# Patient Record
Sex: Female | Born: 1970 | Race: Black or African American | Hispanic: No | Marital: Single | Smoking: Never smoker
Health system: Southern US, Community
[De-identification: ages and names within clinical notes are randomized; demographics above are authoritative.]

## PROBLEM LIST (undated history)

## (undated) DIAGNOSIS — F419 Anxiety disorder, unspecified: Secondary | ICD-10-CM

## (undated) DIAGNOSIS — Z789 Other specified health status: Secondary | ICD-10-CM

## (undated) DIAGNOSIS — F32A Depression, unspecified: Secondary | ICD-10-CM

## (undated) DIAGNOSIS — N809 Endometriosis, unspecified: Secondary | ICD-10-CM

## (undated) DIAGNOSIS — F329 Major depressive disorder, single episode, unspecified: Secondary | ICD-10-CM

## (undated) DIAGNOSIS — R51 Headache: Secondary | ICD-10-CM

## (undated) HISTORY — PX: PELVIC LAPAROSCOPY: SHX162

## (undated) HISTORY — DX: Endometriosis, unspecified: N80.9

---

## 2003-03-13 ENCOUNTER — Encounter: Payer: Self-pay | Admitting: Emergency Medicine

## 2003-03-13 ENCOUNTER — Emergency Department (HOSPITAL_COMMUNITY): Admission: EM | Admit: 2003-03-13 | Discharge: 2003-03-13 | Payer: Self-pay | Admitting: Emergency Medicine

## 2005-04-20 HISTORY — PX: AUGMENTATION MAMMAPLASTY: SUR837

## 2005-04-20 HISTORY — PX: ABDOMINAL SURGERY: SHX537

## 2007-08-29 ENCOUNTER — Inpatient Hospital Stay (HOSPITAL_COMMUNITY): Admission: AD | Admit: 2007-08-29 | Discharge: 2007-09-01 | Payer: Self-pay | Admitting: Obstetrics and Gynecology

## 2007-08-30 ENCOUNTER — Encounter (INDEPENDENT_AMBULATORY_CARE_PROVIDER_SITE_OTHER): Payer: Self-pay | Admitting: Obstetrics and Gynecology

## 2008-09-29 ENCOUNTER — Emergency Department (HOSPITAL_COMMUNITY): Admission: EM | Admit: 2008-09-29 | Discharge: 2008-09-29 | Payer: Self-pay | Admitting: Emergency Medicine

## 2009-08-05 ENCOUNTER — Ambulatory Visit: Payer: Self-pay | Admitting: Gynecology

## 2011-03-01 ENCOUNTER — Emergency Department (HOSPITAL_COMMUNITY)
Admission: EM | Admit: 2011-03-01 | Discharge: 2011-03-01 | Disposition: A | Discharge status: Home / Self Care | Payer: Managed Care, Other (non HMO) | Attending: Emergency Medicine | Admitting: Emergency Medicine

## 2011-03-01 DIAGNOSIS — N938 Other specified abnormal uterine and vaginal bleeding: Secondary | ICD-10-CM | POA: Insufficient documentation

## 2011-03-01 DIAGNOSIS — N949 Unspecified condition associated with female genital organs and menstrual cycle: Secondary | ICD-10-CM | POA: Insufficient documentation

## 2011-03-01 LAB — POCT I-STAT, CHEM 8
BUN: 10 mg/dL (ref 6–23)
Calcium, Ion: 1.06 mmol/L — ABNORMAL LOW (ref 1.12–1.32)
Creatinine, Ser: 0.8 mg/dL (ref 0.4–1.2)
Glucose, Bld: 94 mg/dL (ref 70–99)
Hemoglobin: 13.9 g/dL (ref 12.0–15.0)
TCO2: 28 mmol/L (ref 0–100)

## 2011-03-01 LAB — URINALYSIS, ROUTINE W REFLEX MICROSCOPIC
Glucose, UA: NEGATIVE mg/dL
Protein, ur: NEGATIVE mg/dL
Specific Gravity, Urine: 1.008 (ref 1.005–1.030)
pH: 6.5 (ref 5.0–8.0)

## 2011-03-02 ENCOUNTER — Ambulatory Visit (INDEPENDENT_AMBULATORY_CARE_PROVIDER_SITE_OTHER): Payer: Managed Care, Other (non HMO) | Admitting: Gynecology

## 2011-03-02 DIAGNOSIS — N920 Excessive and frequent menstruation with regular cycle: Secondary | ICD-10-CM

## 2011-03-02 LAB — GC/CHLAMYDIA PROBE AMP, GENITAL
Chlamydia, DNA Probe: NEGATIVE
GC Probe Amp, Genital: NEGATIVE

## 2011-03-14 ENCOUNTER — Ambulatory Visit (INDEPENDENT_AMBULATORY_CARE_PROVIDER_SITE_OTHER): Payer: Managed Care, Other (non HMO) | Admitting: Gynecology

## 2011-03-14 ENCOUNTER — Other Ambulatory Visit: Payer: Self-pay | Admitting: Gynecology

## 2011-03-14 ENCOUNTER — Other Ambulatory Visit: Payer: Managed Care, Other (non HMO)

## 2011-03-14 DIAGNOSIS — N920 Excessive and frequent menstruation with regular cycle: Secondary | ICD-10-CM

## 2011-03-14 DIAGNOSIS — N854 Malposition of uterus: Secondary | ICD-10-CM

## 2011-03-14 DIAGNOSIS — N831 Corpus luteum cyst of ovary, unspecified side: Secondary | ICD-10-CM

## 2011-03-20 ENCOUNTER — Ambulatory Visit (INDEPENDENT_AMBULATORY_CARE_PROVIDER_SITE_OTHER): Payer: Managed Care, Other (non HMO) | Admitting: Gynecology

## 2011-03-20 DIAGNOSIS — Z3049 Encounter for surveillance of other contraceptives: Secondary | ICD-10-CM

## 2011-04-04 NOTE — Op Note (Signed)
NAMECyanna, Neace Mattilynn             ACCOUNT NO.:  [id number]   MEDICAL RECORD NO.:  [id number]          PATIENT TYPE:  INP   LOCATION:  9147                          FACILITY:  [facility]   PHYSICIAN:  Dineen Kid. Rana Snare, M.D.    DATE OF BIRTH:  09-Mar-1971   DATE OF PROCEDURE:  08/30/2007  DATE OF DISCHARGE:                               OPERATIVE REPORT   PREOPERATIVE DIAGNOSES:  Intrauterine pregnancy at 39 weeks, fetal  intolerance to labor.   POSTOPERATIVE DIAGNOSES:  Intrauterine pregnancy at 39 weeks, fetal  intolerance to labor.  Brow presentation.   PROCEDURE:  Primary low segment transverse cesarean section.   SURGEON:  Dineen Kid. Rana Snare, M.D.   ANESTHESIA:  Epidural.   INDICATIONS:  Ms. Ponzo is a 40 year old G5, P2, A2, who presented at  38 weeks with ruptured membranes, clear fluid at 6 a.m.  Pregnancy was  otherwise uncomplicated, except for advanced maternal age, where she  declined amniocentesis.  She progressed to 7 cm, had a protracted labor.  Due to fetal intolerance to labor with variable decelerations,  amnioinfusion was performed.  Unable to advance.  Pitocin augmentation  due to fetal intolerance to labor.  After multiple attempts with  position change, IV fluids, oxygen, amnioinfusion, unable to progress  beyond 7 cm, due to fetal intolerance of labor, we proceeded with  primary low segment transverse cesarean section.  Risks and benefits  were discussed and informed consent was obtained.   FINDINGS AT TIME OF SURGERY:  Viable female infant, Apgars of 6 and 9, pH  arterial 7.18, and brow presentation.   DESCRIPTION OF PROCEDURE:  After adequate analgesia, the patient was  placed in the supine position, left lateral tilt.  She was sterilely  prepped and draped.  The bladder was sterilely drained.  A Pfannenstiel  skin incision was made two fingerbreadths above the pubic symphysis,  taken down sharply to the fascia, which was incised transversely,  extended superiorly  and inferiorly.  The bellies of the rectus muscle  were separated sharply in the midline.  The peritoneum was entered  sharply, a bladder flap created and placed behind the bladder blade.  A  low segment myotomy incision was made down to the infant's vertex,  extended laterally with the operator's fingertips.  The infant's vertex  was delivered atraumatically.  The nares and pharynx were suctioned.  The infant was delivered, cord clamped, cut and handed to the  pediatrician for resuscitation.   Cord blood was obtained.  Placenta was extracted manually.  The uterus  was exteriorized, wiped clean with a dry lap.  The myotomy incision was  closed in two layers, the first being a running, locking layer, the  second being an imbricating layer, with 0 Monocryl suture.  A 3-4 cm  hematoma was noted in the bladder wall, but it was no longer expanding.  The uterus was placed back into the peritoneal cavity and, after a  copious amount of irrigation, adequate hemostasis was assured.  Re-  examination of the bladder hematoma revealed no further expansion of the  hematoma.  The peritoneum was then closed  with 0 Monocryl suture, rectus  muscle plicated in the midline.  Irrigation was applied and after  adequate hemostasis, the fascia was closed with a #1 Vicryl in running  fashion.  Irrigation was applied.  After adequate hemostasis, the Scarpa  and Camper's fascia were reapproximated with 0 plain suture.  The  skin was then stapled.  Steri-Strips were applied.  The patient  tolerated the procedure well and was stable on transfer to the recovery  room.   Sponge and instrument count was normal times three.  Estimated blood  loss was 800 mL.      Dineen Kid Rana Snare, M.D.  Electronically Signed     DCL/MEDQ  D:  08/30/2007  T:  08/30/2007  Job:  [id number]

## 2011-04-07 NOTE — Discharge Summary (Signed)
NAMEArva, Slaugh Maudean             ACCOUNT NO.:  [id number]   MEDICAL RECORD NO.:  [id number]          PATIENT TYPE:  INP   LOCATION:  9147                          FACILITY:  [facility]   PHYSICIAN:  Michelle L. Grewal, M.D.DATE OF BIRTH:  [birth year]   DATE OF ADMISSION:  08/29/2007  DATE OF DISCHARGE:  09/01/2007                               DISCHARGE SUMMARY   ADMITTING DIAGNOSIS:  1. Intrauterine pregnancy at 2 weeks estimated gestational age.  2. Spontaneous rupture of membranes.   DISCHARGE DIAGNOSIS:  1. Status post low transverse cesarean section secondary to fetal      intolerance to labor with brow out presentation.  2. Viable female infant.   PROCEDURE:  Primary low transverse cesarean section.   REASON FOR ADMISSION:  Please see written H&P.   HOSPITAL COURSE:  The patient is 40 year old, gravida 5, para 2 that  presented to [facility] at 38 weeks estimated  gestational age with spontaneous rupture of membranes.  Fluid was noted  to be clear on admission, fetal heart tones were reactive. The cervix  was examined and noted to be 3 cm dilated, vertex presentation at a -2  station.  The patient did progress to 7 cm dilated however, the patient  did experience some protracted labor and amnioinfusion was performed.  The Pitocin augmentation was administered to augment her labor however,  the fetus did not tolerate well recurrent variable decelerations.  Pitocin was discontinued and after multiple attempts with position  change and IV fluids and oxygen administration, the patient never  progressed beyond 7 cm in dilatation. A decision was made then to  proceed with a primary low transverse cesarean section.  The patient was  then transferred to the operating room where epidural was dosed to an  adequate surgical level. A low transverse incision was made with the  delivery of viable female infant weighing 7 pounds 6 ounces, Apgar's of 6  at 1 minutes 9 at 5 minutes.   Arterial cord pH was 7.18.  The patient  tolerated the procedure well and was taken to the recovery room in  stable condition.  On postoperative day #1, the patient was without  complaint.  Vital signs were stable.  She was afebrile.  Urine output  was noted to be adequate.  The abdomen was soft, fundus firm and  nontender.  Abdominal dressing was noted to be clean, dry and intact.   LABORATORY FINDINGS:  Hemoglobin of 8.9, platelet count of 233,000, WBC  count of 11.9. On postoperative day #2, the patient was without  complaint, vital signs remained stable, she is afebrile.  Abdomen soft.  Fundus firm and nontender.  Abdominal dressing had been removed  revealing an incision that was clean, dry and intact.  She was  ambulating well. On postoperative day #3, the patient was without  complaint.  Vital signs were stable.  She was afebrile.  Fundus firm and  nontender.  Incision was clean, dry and intact.  Discharge instructions  were reviewed and the patient was later discharged home.   CONDITION ON DISCHARGE:  Good.   DIET:  Regular as tolerated.   ACTIVITY:  No heavy lifting, no driving x2 weeks, no vaginal entry.   FOLLOW UP:  Patient to follow up in the office in 1-2 weeks for an  incision check.  She is to call for a temperature greater than 100  degrees, persistent nausea, vomiting, heavy vaginal bleeding and/or  redness or drainage to the incisional site.   DISCHARGE MEDICATION:  1. Tylox x30, 1 p.o. every 4-6 hours.  2. Motrin 600 mg every 6 hours.  3. Prenatal vitamins one p.o. daily.  4. Colace 1 p.o. daily p.r.n.      Julio Sicks, N.P.      Stann Mainland. Vincente Poli, M.D.  Electronically Signed    CC/MEDQ  D:  09/25/2007  T:  09/25/2007  Job:  [id number]

## 2011-04-19 ENCOUNTER — Ambulatory Visit (INDEPENDENT_AMBULATORY_CARE_PROVIDER_SITE_OTHER): Payer: Managed Care, Other (non HMO) | Admitting: Gynecology

## 2011-04-19 DIAGNOSIS — Z30431 Encounter for routine checking of intrauterine contraceptive device: Secondary | ICD-10-CM

## 2011-04-19 HISTORY — PX: INTRAUTERINE DEVICE INSERTION: SHX323

## 2011-05-17 ENCOUNTER — Ambulatory Visit (INDEPENDENT_AMBULATORY_CARE_PROVIDER_SITE_OTHER): Payer: Managed Care, Other (non HMO) | Admitting: Gynecology

## 2011-05-17 DIAGNOSIS — N920 Excessive and frequent menstruation with regular cycle: Secondary | ICD-10-CM

## 2011-05-17 DIAGNOSIS — Z30431 Encounter for routine checking of intrauterine contraceptive device: Secondary | ICD-10-CM

## 2011-05-23 ENCOUNTER — Other Ambulatory Visit (HOSPITAL_COMMUNITY)
Admission: RE | Admit: 2011-05-23 | Discharge: 2011-05-23 | Disposition: A | Discharge status: Home / Self Care | Payer: Managed Care, Other (non HMO) | Source: Ambulatory Visit | Attending: Gynecology | Admitting: Gynecology

## 2011-05-23 ENCOUNTER — Other Ambulatory Visit: Payer: Self-pay | Admitting: Gynecology

## 2011-05-23 ENCOUNTER — Encounter (INDEPENDENT_AMBULATORY_CARE_PROVIDER_SITE_OTHER): Payer: Managed Care, Other (non HMO) | Admitting: Gynecology

## 2011-05-23 DIAGNOSIS — Z833 Family history of diabetes mellitus: Secondary | ICD-10-CM

## 2011-05-23 DIAGNOSIS — Z01419 Encounter for gynecological examination (general) (routine) without abnormal findings: Secondary | ICD-10-CM

## 2011-05-23 DIAGNOSIS — Z124 Encounter for screening for malignant neoplasm of cervix: Secondary | ICD-10-CM | POA: Insufficient documentation

## 2011-05-23 DIAGNOSIS — Z13220 Encounter for screening for lipoid disorders: Secondary | ICD-10-CM

## 2011-08-31 LAB — CBC
HCT: 39
Hemoglobin: 13.3
Hemoglobin: 8.9 — ABNORMAL LOW
MCHC: 34.3
MCV: 83.4
RBC: 3.11 — ABNORMAL LOW
RDW: 13.6
WBC: 9.3

## 2011-08-31 LAB — CCBB MATERNAL DONOR DRAW

## 2011-09-28 ENCOUNTER — Ambulatory Visit
Admission: RE | Admit: 2011-09-28 | Discharge: 2011-09-28 | Disposition: A | Discharge status: Home / Self Care | Payer: Managed Care, Other (non HMO) | Source: Ambulatory Visit | Attending: Family Medicine | Admitting: Family Medicine

## 2011-09-28 ENCOUNTER — Other Ambulatory Visit (INDEPENDENT_AMBULATORY_CARE_PROVIDER_SITE_OTHER): Payer: Self-pay | Admitting: General Surgery

## 2011-09-28 ENCOUNTER — Encounter (HOSPITAL_COMMUNITY): Payer: Self-pay | Admitting: Emergency Medicine

## 2011-09-28 ENCOUNTER — Emergency Department (HOSPITAL_COMMUNITY)
Admission: EM | Admit: 2011-09-28 | Discharge: 2011-09-28 | Disposition: A | Discharge status: Home / Self Care | Payer: Managed Care, Other (non HMO) | Attending: Emergency Medicine | Admitting: Emergency Medicine

## 2011-09-28 ENCOUNTER — Other Ambulatory Visit: Payer: Self-pay | Admitting: Family Medicine

## 2011-09-28 DIAGNOSIS — K8020 Calculus of gallbladder without cholecystitis without obstruction: Secondary | ICD-10-CM

## 2011-09-28 DIAGNOSIS — R112 Nausea with vomiting, unspecified: Secondary | ICD-10-CM | POA: Insufficient documentation

## 2011-09-28 DIAGNOSIS — R1011 Right upper quadrant pain: Secondary | ICD-10-CM | POA: Insufficient documentation

## 2011-09-28 DIAGNOSIS — R109 Unspecified abdominal pain: Secondary | ICD-10-CM

## 2011-09-28 LAB — LIPASE, BLOOD: Lipase: 22 U/L (ref 11–59)

## 2011-09-28 LAB — COMPREHENSIVE METABOLIC PANEL
Albumin: 4 g/dL (ref 3.5–5.2)
Alkaline Phosphatase: 80 U/L (ref 39–117)
BUN: 8 mg/dL (ref 6–23)
CO2: 30 mEq/L (ref 19–32)
Chloride: 99 mEq/L (ref 96–112)
GFR calc non Af Amer: 88 mL/min — ABNORMAL LOW (ref >90)
Potassium: 3.9 mEq/L (ref 3.5–5.1)
Total Bilirubin: 0.4 mg/dL (ref 0.3–1.2)

## 2011-09-28 LAB — CBC
HCT: 42.2 % (ref 36.0–46.0)
Hemoglobin: 14.4 g/dL (ref 12.0–15.0)
MCV: 84.7 fL (ref 78.0–100.0)
RBC: 4.98 MIL/uL (ref 3.87–5.11)
RDW: 12.8 % (ref 11.5–15.5)
WBC: 7.4 10*3/uL (ref 4.0–10.5)

## 2011-09-28 NOTE — ED Notes (Signed)
Pt states she was sent to ed by her pcp had ultrasound today and was told to present to ed for r/o gallstones. Pt denies pain at this time pt states positive nausea and vomiting

## 2011-09-28 NOTE — ED Notes (Signed)
Patient is resting comfortably. 

## 2011-09-28 NOTE — ED Notes (Signed)
Reports nausea and vomitting. Denies pain today. Pain was 10/10 on Monday night. Went to urgent care and been on pain meds since.

## 2011-09-28 NOTE — ED Provider Notes (Signed)
History     CSN: [id number] Arrival date & time: 09/28/2011  4:23 PM   First MD Initiated Contact with Patient 09/28/11 1642      Chief Complaint  Patient presents with  . Abdominal Pain     HPI-General screening exam  History is provided by the patient.  Patient is sent from PCP for further evaluation of upper abdominal pain and rule out gallstone.  Dr. Kae Heller from Gen. surgery was consulted on the patient has been to evaluate patient. Patient has been having pains associated around eating with nausea and vomiting symptoms.  She denies fever or pain at this time.  Patient states testing has been completed and Dr. Kae Heller has developed a plan for outpatient surgery with her.  Labs today do not show any emergent conditions.  The patient states she is ready to return home.   Past Medical History  Diagnosis Date  . No active medical problems     Past Surgical History  Procedure Date  . Cesarean section   . Cosmetic surgery 04/2005    abdominoplasty/breast augmentation  . Augmentation mammaplasty 04/2005  . Abdominal surgery 04/2005    abdominoplasty  . Cesarean section 08/2007    History reviewed. No pertinent family history.  History  Substance Use Topics  . Smoking status: Unknown If Ever Smoked  . Smokeless tobacco: Not on file  . Alcohol Use: No    OB History    Grav Para Term Preterm Abortions TAB SAB Ect Mult Living                  Review of Systems  Constitutional: Negative for fever.  Gastrointestinal: Positive for nausea, vomiting and abdominal pain.    Allergies  Review of patient's allergies indicates no known allergies.  Home Medications   Current Outpatient Rx  Name Route Sig Dispense Refill  . ESOMEPRAZOLE MAGNESIUM 40 MG PO CPDR Oral Take 40 mg by mouth daily before breakfast.      . ONDANSETRON HCL 4 MG PO TABS Oral Take 4 mg by mouth every 6 (six) hours.      Marland Kitchen PHENTERMINE HCL 37.5 MG PO CAPS Oral Take 37.5 mg by mouth every morning.       Marland Kitchen TRAMADOL HCL 50 MG PO TABS Oral Take 50 mg by mouth every 4 (four) hours. Maximum dose= 8 tablets per day     . LEVONORGESTREL 20 MCG/24HR IU IUD Intrauterine 1 each by Intrauterine route once. Inserted 04/19/11       BP 115/78  Pulse 83  Temp(Src) 98.5 F (36.9 C) (Oral)  Resp 20  SpO2 100%  LMP 08/30/2011  Physical Exam  Constitutional: She is oriented to person, place, and time. She appears well-developed and well-nourished.  Cardiovascular: Normal rate and regular rhythm.   Abdominal: There is tenderness in the right upper quadrant and epigastric area.       Tenderness is mild  Neurological: She is alert and oriented to person, place, and time.  Skin: Skin is warm.    ED Course  Procedures (including critical care time)  Labs Reviewed  COMPREHENSIVE METABOLIC PANEL - Abnormal; Notable for the following:    ALT 46 (*)    GFR calc non Af Amer 88 (*)    All other components within normal limits  CBC  LIPASE, BLOOD   Results for orders placed during the hospital encounter of 09/28/11  CBC      Component Value Range   WBC 7.4  4.0 -  10.5 (K/uL)   RBC 4.98  3.87 - 5.11 (MIL/uL)   Hemoglobin 14.4  12.0 - 15.0 (g/dL)   HCT 16.1  09.6 - 04.5 (%)   MCV 84.7  78.0 - 100.0 (fL)   MCH 28.9  26.0 - 34.0 (pg)   MCHC 34.1  30.0 - 36.0 (g/dL)   RDW 40.9  81.1 - 91.4 (%)   Platelets 270  150 - 400 (K/uL)  COMPREHENSIVE METABOLIC PANEL      Component Value Range   Sodium 136  135 - 145 (mEq/L)   Potassium 3.9  3.5 - 5.1 (mEq/L)   Chloride 99  96 - 112 (mEq/L)   CO2 30  19 - 32 (mEq/L)   Glucose, Bld 77  70 - 99 (mg/dL)   BUN 8  6 - 23 (mg/dL)   Creatinine, Ser 7.82  0.50 - 1.10 (mg/dL)   Calcium 9.4  8.4 - 95.6 (mg/dL)   Total Protein 7.1  6.0 - 8.3 (g/dL)   Albumin 4.0  3.5 - 5.2 (g/dL)   AST 19  0 - 37 (U/L)   ALT 46 (*) 0 - 35 (U/L)   Alkaline Phosphatase 80  39 - 117 (U/L)   Total Bilirubin 0.4  0.3 - 1.2 (mg/dL)   GFR calc non Af Amer 88 (*) >90 (mL/min)   GFR  calc Af Amer >90  >90 (mL/min)  LIPASE, BLOOD      Component Value Range   Lipase 22  11 - 59 (U/L)     US Abdomen Complete  09/28/2011  *RADIOLOGY REPORT*  Clinical Data:  Right upper quadrant pain, elevated LFTs  ABDOMINAL ULTRASOUND COMPLETE  Comparison:  None.  Findings:  Gallbladder:  Numerous echogenic shadowing layering gallstones. Despite this, there is no elicited Murphy's sign, wall thickening or pericholecystic fluid.  No signs of cholecystitis.  Wall thickness measures 1.9 mm.  Common Bile Duct:  Within normal limits in caliber.  Liver: Normal echogenicity.  Incidental right posterior hepatic lobe 11 mm cyst beneath the diaphragm identified.  No biliary dilatation.  IVC:  Appears normal.  Pancreas:  No abnormality identified.  Spleen:  Within normal limits in size and echotexture.  Right kidney:  Normal in size and parenchymal echogenicity.  No evidence of mass or hydronephrosis.  Left kidney:  Normal in size and parenchymal echogenicity.  No evidence of mass or hydronephrosis.  Abdominal Aorta:  No aneurysm identified.  IMPRESSION: Cholelithiasis.  No ultrasound evidence of acute cholecystitis.  Incidental 11 mm hepatic cyst.  Original Report Authenticated By: Judie Petit. Ruel Favors, M.D.     1. Cholelithiasis       MDM          Angus Seller, [place] 09/28/11 2052

## 2011-09-28 NOTE — ED Notes (Signed)
Pt here from dr office for gallstones.

## 2011-09-28 NOTE — Progress Notes (Signed)
Reason for Consult:abdominal pain and gallstones Referring Physician: Dr. Enrico Jones  Christina Ramsey is an 40 y.o. female.  HPI:   She had an episode of pressure type mid sternal pain and nausea last month that resolved spontaneously. 3 days ago she had a severe case of epigastric pain with nausea and vomiting. She went to urgent care and was seen by Dr. Enrico Jones.  Lab work was done and demonstrated mild elevation of some of her liver function tests. She underwent ultrasound today demonstrating gallstones but no gallbladder wall thickening or pericholecystic fluid. No dilated common bile duct. There was concern that she might have acute cholecystitis and we were asked to see here. She was directed to the emergency department at [facility] for further evaluation and treatment.  No fever or chills. No diarrhea.  Past Medical History  Diagnosis Date  . No active medical problems     Past Surgical History  Procedure Date  . Cesarean section   . Cosmetic surgery 04/2005    abdominoplasty/breast augmentation  . Augmentation mammaplasty 04/2005  . Abdominal surgery 04/2005    abdominoplasty  . Cesarean section 08/2007    Allergies: No Known Allergies  Medications: I have reviewed the patient's current medications.  No family history on file.  Social History:  has an unknown smoking status. She does not have any smokeless tobacco history on file. She reports that she does not drink alcohol or use illicit drugs.  ROS General:  Negative  Breast:  Negative  Infectious Diseases: Negative  Cardiac  :  Negative  Pulmonary:  Negative  Endocrine:  Negative  Skin:  Negative  Gastrointestinal:  Negative  Genitourinary:  Negative  Neurological:  Negative  Hematologic/Lymphatic:  Negative  HEENT:  Negative  Musculoskeletal:  Negative   Blood pressure 115/78, pulse 83, temperature 98.5 F (36.9 C), temperature source Oral, resp. rate 20, last menstrual period 08/30/2011, SpO2  100.00%.  BP 115/78  Pulse 83  Temp(Src) 98.5 F (36.9 C) (Oral)  Resp 20  SpO2 100%  LMP 08/30/2011  PE        General Appearance:  Alert, cooperative, no distress, appears stated age  Head:  Normocephalic, without obvious abnormality, atraumatic  Eyes:   Conjunctiva/corneas clear, EOM's intact      Nose: Nares normal, no drainage   Mouth: Mucous membranes moist  Neck: Supple, symmetrical, trachea midline, no tenderness/mass/nodules, no JVD  Back:   Symmetric, no curvature, no CVA tenderness  Lungs:   Clear to auscultation bilaterally, respirations unlabored  Chest Wall:  No tenderness or deformity  Heart:  Regular rate and rhythm, S1, S2 normal, no murmur, rub or gallop  Abdomen:   Soft, non-tender, bowel sounds active all four quadrants,  no masses, no organomegaly, lower midline and periumbilical scars.          Extremities: Extremities normal, atraumatic, no cyanosis or edema     Skin: Skin color, texture, turgor normal, no rashes or lesions  Lymph nodes: No enlarged cervical or supraclavicular nodes                                  Neuro:  Alert and oriented, no focal deficits          Results for orders placed during the hospital encounter of 09/28/11 (from the past 48 hour(s))  CBC     Status: Normal   Collection Time   09/28/11  5:30 PM        Component Value Range Comment   WBC 7.4  4.0 - 10.5 (K/uL)    RBC 4.98  3.87 - 5.11 (MIL/uL)    Hemoglobin 14.4  12.0 - 15.0 (g/dL)    HCT 42.2  36.0 - 46.0 (%)    MCV 84.7  78.0 - 100.0 (fL)    MCH 28.9  26.0 - 34.0 (pg)    MCHC 34.1  30.0 - 36.0 (g/dL)    RDW 12.8  11.5 - 15.5 (%)    Platelets 270  150 - 400 (K/uL)   COMPREHENSIVE METABOLIC PANEL     Status: Abnormal   Collection Time   09/28/11  5:30 PM      Component Value Range Comment   Sodium 136  135 - 145 (mEq/L)    Potassium 3.9  3.5 - 5.1 (mEq/L)    Chloride 99  96 - 112 (mEq/L)    CO2 30  19 - 32 (mEq/L)    Glucose, Bld 77  70 - 99 (mg/dL)    BUN 8   6 - 23 (mg/dL)    Creatinine, Ser 0.83  0.50 - 1.10 (mg/dL)    Calcium 9.4  8.4 - 10.5 (mg/dL)    Total Protein 7.1  6.0 - 8.3 (g/dL)    Albumin 4.0  3.5 - 5.2 (g/dL)    AST 19  0 - 37 (U/L)    ALT 46 (*) 0 - 35 (U/L)    Alkaline Phosphatase 80  39 - 117 (U/L)    Total Bilirubin 0.4  0.3 - 1.2 (mg/dL)    GFR calc non Af Amer 88 (*) >90 (mL/min)    GFR calc Af Amer >90  >90 (mL/min)   LIPASE, BLOOD     Status: Normal   Collection Time   09/28/11  5:30 PM      Component Value Range Comment   Lipase 22  11 - 59 (U/L)     Us Abdomen Complete  09/28/2011  *RADIOLOGY REPORT*  Clinical Data:  Right upper quadrant pain, elevated LFTs  ABDOMINAL ULTRASOUND COMPLETE  Comparison:  None.  Findings:  Gallbladder:  Numerous echogenic shadowing layering gallstones. Despite this, there is no elicited Murphy's sign, wall thickening or pericholecystic fluid.  No signs of cholecystitis.  Wall thickness measures 1.9 mm.  Common Bile Duct:  Within normal limits in caliber.  Liver: Normal echogenicity.  Incidental right posterior hepatic lobe 11 mm cyst beneath the diaphragm identified.  No biliary dilatation.  IVC:  Appears normal.  Pancreas:  No abnormality identified.  Spleen:  Within normal limits in size and echotexture.  Right kidney:  Normal in size and parenchymal echogenicity.  No evidence of mass or hydronephrosis.  Left kidney:  Normal in size and parenchymal echogenicity.  No evidence of mass or hydronephrosis.  Abdominal Aorta:  No aneurysm identified.  IMPRESSION: Cholelithiasis.  No ultrasound evidence of acute cholecystitis.  Incidental 11 mm hepatic cyst.  Original Report Authenticated By: M. TREVOR SHICK, M.D.    Assessment/Plan: Acute biliary colic secondary to cholelithiasis that has resolved. No evidence of acute cholecystitis.  Plan: Discharge emergency department. Strict low-fat nonfat diet. Schedule elective outpatient  Laparoscopic cholecystectomy.  I have explained the procedure, risks,  and aftercare of cholecystectomy.  Risks include but are not limited to bleeding, infection, wound problems, anesthesia, diarrhea, bile leak, injury to common bile duct/liver/intestine.  She seems to understand and agrees to proceed.   Demika Langenderfer J 09/28/2011, 7:02 PM      

## 2011-09-28 NOTE — ED Notes (Signed)
Family at bedside. 

## 2011-09-29 ENCOUNTER — Telehealth (INDEPENDENT_AMBULATORY_CARE_PROVIDER_SITE_OTHER): Payer: Self-pay

## 2011-09-29 ENCOUNTER — Other Ambulatory Visit (INDEPENDENT_AMBULATORY_CARE_PROVIDER_SITE_OTHER): Payer: Self-pay | Admitting: General Surgery

## 2011-09-29 NOTE — Telephone Encounter (Signed)
The patient called in to get scheduled for lap chole and had some questions about recovery.  I advised recovery is about 1 to 2 weeks.  She could be out of work up to 2 weeks depending on what type of work she does.  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

## 2011-09-29 NOTE — ED Provider Notes (Signed)
Medical screening examination/treatment/procedure(s) were performed by non-physician practitioner and as supervising physician I was immediately available for consultation/collaboration.   Nelia Shi, MD 09/29/11 [phone]

## 2011-10-04 ENCOUNTER — Telehealth (INDEPENDENT_AMBULATORY_CARE_PROVIDER_SITE_OTHER): Payer: Self-pay

## 2011-10-04 NOTE — Telephone Encounter (Signed)
Spoke with patient to remind her no asa products 7 days prior to her lap cholecystectomy.

## 2011-10-25 ENCOUNTER — Encounter (HOSPITAL_COMMUNITY)
Admission: RE | Admit: 2011-10-25 | Discharge: 2011-10-25 | Disposition: A | Discharge status: Home / Self Care | Payer: Managed Care, Other (non HMO) | Source: Ambulatory Visit | Attending: General Surgery | Admitting: General Surgery

## 2011-10-25 ENCOUNTER — Encounter (HOSPITAL_COMMUNITY): Payer: Self-pay

## 2011-10-25 NOTE — Pre-Procedure Instructions (Signed)
Chest x ray per anesthesia not done due to negative history

## 2011-10-25 NOTE — Pre-Procedure Instructions (Signed)
EKG REPORT 11/12 ON CHART,   LABS- CBC 09/28/11,CMP 09/29/11 IN EPIC

## 2011-10-25 NOTE — Patient Instructions (Signed)
20 Christina Ramsey  10/25/2011   Your procedure is scheduled on:  10/26/11  Surgery  [id number]  Thursday  Report to [facility] at 1115 AM.  Call this number if you have problems the morning of surgery: [phone]   [facility]  PST [id number]   Remember:   Do not eat food:After Midnight. tonight  May have clear liquids:until Midnight . tonight  Clear liquids include soda, tea, black coffee, apple or grape juice, broth.  Take these medicines the morning of surgery with A SIP OF WATER: none              No antiinflammatories  Do not wear jewelry, make-up or nail polish.  Do not wear lotions, powders, or perfumes. You may wear deodorant.  Do not shave 48 hours prior to surgery.  Do not bring valuables to the hospital.  Contacts, dentures or bridgework may not be worn into surgery.  Leave suitcase in the car. After surgery it may be brought to your room.  For patients admitted to the hospital, checkout time is 11:00 AM the day of discharge.   Patients discharged the day of surgery will not be allowed to drive home.  Name and phone number of your driver:friend  Special Instructions: CHG Shower Use Special Wash: 1/2 bottle night before surgery and 1/2 bottle morning of surgery.  Regular soap face and privates, no shaving tonight.       Please read over the following fact sheets that you were given: MRSA Information

## 2011-10-26 ENCOUNTER — Encounter (HOSPITAL_COMMUNITY): Payer: Self-pay | Admitting: *Deleted

## 2011-10-26 ENCOUNTER — Ambulatory Visit (HOSPITAL_COMMUNITY): Payer: Managed Care, Other (non HMO) | Admitting: Anesthesiology

## 2011-10-26 ENCOUNTER — Other Ambulatory Visit (INDEPENDENT_AMBULATORY_CARE_PROVIDER_SITE_OTHER): Payer: Self-pay | Admitting: General Surgery

## 2011-10-26 ENCOUNTER — Ambulatory Visit (HOSPITAL_COMMUNITY)
Admission: RE | Admit: 2011-10-26 | Discharge: 2011-10-26 | Disposition: A | Discharge status: Home / Self Care | Payer: Managed Care, Other (non HMO) | Source: Ambulatory Visit | Attending: General Surgery | Admitting: General Surgery

## 2011-10-26 ENCOUNTER — Encounter (HOSPITAL_COMMUNITY)
Admission: RE | Disposition: A | Discharge status: Home / Self Care | Payer: Self-pay | Source: Ambulatory Visit | Attending: General Surgery

## 2011-10-26 ENCOUNTER — Encounter (HOSPITAL_COMMUNITY): Payer: Self-pay | Admitting: Anesthesiology

## 2011-10-26 ENCOUNTER — Ambulatory Visit (HOSPITAL_COMMUNITY): Payer: Managed Care, Other (non HMO)

## 2011-10-26 DIAGNOSIS — K8010 Calculus of gallbladder with chronic cholecystitis without obstruction: Secondary | ICD-10-CM

## 2011-10-26 DIAGNOSIS — Z01812 Encounter for preprocedural laboratory examination: Secondary | ICD-10-CM | POA: Insufficient documentation

## 2011-10-26 HISTORY — PX: CHOLECYSTECTOMY: SHX55

## 2011-10-26 SURGERY — LAPAROSCOPIC CHOLECYSTECTOMY WITH INTRAOPERATIVE CHOLANGIOGRAM
Anesthesia: General | Site: Abdomen | Wound class: Clean Contaminated

## 2011-10-26 MED ORDER — ROCURONIUM BROMIDE 100 MG/10ML IV SOLN
INTRAVENOUS | Status: DC | PRN
  Administered 2011-10-26: 40 mg via INTRAVENOUS

## 2011-10-26 MED ORDER — BUPIVACAINE HCL 0.5 % IJ SOLN
INTRAMUSCULAR | Status: AC
  Filled 2011-10-26: qty 1

## 2011-10-26 MED ORDER — IOHEXOL 300 MG/ML  SOLN
INTRAMUSCULAR | Status: AC
  Filled 2011-10-26: qty 1

## 2011-10-26 MED ORDER — KETOROLAC TROMETHAMINE 30 MG/ML IJ SOLN
INTRAMUSCULAR | Status: DC | PRN
  Administered 2011-10-26: 30 mg via INTRAVENOUS

## 2011-10-26 MED ORDER — IOHEXOL 300 MG/ML  SOLN
INTRAMUSCULAR | Status: DC | PRN
  Administered 2011-10-26: 2 mL via INTRAVENOUS

## 2011-10-26 MED ORDER — CEFAZOLIN SODIUM 1-5 GM-% IV SOLN
1.0000 g | INTRAVENOUS | Status: AC
  Administered 2011-10-26: 1 g via INTRAVENOUS

## 2011-10-26 MED ORDER — ACETAMINOPHEN 10 MG/ML IV SOLN
INTRAVENOUS | Status: AC
  Filled 2011-10-26: qty 100

## 2011-10-26 MED ORDER — OXYCODONE HCL 5 MG PO TABS
ORAL_TABLET | ORAL | Status: AC
  Administered 2011-10-26: 5 mg via ORAL
  Filled 2011-10-26: qty 1

## 2011-10-26 MED ORDER — FENTANYL CITRATE 0.05 MG/ML IJ SOLN
INTRAMUSCULAR | Status: DC | PRN
  Administered 2011-10-26: 100 ug via INTRAVENOUS
  Administered 2011-10-26: 50 ug via INTRAVENOUS

## 2011-10-26 MED ORDER — LIDOCAINE HCL (CARDIAC) 20 MG/ML IV SOLN
INTRAVENOUS | Status: DC | PRN
  Administered 2011-10-26: 50 mg via INTRAVENOUS

## 2011-10-26 MED ORDER — FENTANYL CITRATE 0.05 MG/ML IJ SOLN
INTRAMUSCULAR | Status: AC
  Filled 2011-10-26: qty 2

## 2011-10-26 MED ORDER — BUPIVACAINE HCL 0.5 % IJ SOLN
INTRAMUSCULAR | Status: DC | PRN
  Administered 2011-10-26: 12 mL

## 2011-10-26 MED ORDER — SODIUM CHLORIDE 0.9 % IR SOLN
Status: DC | PRN
  Administered 2011-10-26: 1000 mL

## 2011-10-26 MED ORDER — MIDAZOLAM HCL 5 MG/5ML IJ SOLN
INTRAMUSCULAR | Status: DC | PRN
  Administered 2011-10-26: 2 mg via INTRAVENOUS

## 2011-10-26 MED ORDER — LACTATED RINGERS IV SOLN
INTRAVENOUS | Status: DC
  Administered 2011-10-26: 1000 mL via INTRAVENOUS

## 2011-10-26 MED ORDER — LACTATED RINGERS IR SOLN
Status: DC | PRN
  Administered 2011-10-26: 1000 mL

## 2011-10-26 MED ORDER — NEOSTIGMINE METHYLSULFATE 1 MG/ML IJ SOLN
INTRAMUSCULAR | Status: DC | PRN
  Administered 2011-10-26: 3 mg via INTRAVENOUS

## 2011-10-26 MED ORDER — PROMETHAZINE HCL 25 MG/ML IJ SOLN
12.5000 mg | Freq: Four times a day (QID) | INTRAMUSCULAR | Status: DC | PRN
  Administered 2011-10-26: 12.5 mg via INTRAVENOUS

## 2011-10-26 MED ORDER — KETOROLAC TROMETHAMINE 60 MG/2ML IM SOLN: INTRAMUSCULAR | Status: DC | PRN

## 2011-10-26 MED ORDER — CEFAZOLIN SODIUM 1-5 GM-% IV SOLN
INTRAVENOUS | Status: AC
  Filled 2011-10-26: qty 50

## 2011-10-26 MED ORDER — PROMETHAZINE HCL 25 MG/ML IJ SOLN: 6.2500 mg | INTRAMUSCULAR | Status: DC | PRN

## 2011-10-26 MED ORDER — DEXTROSE IN LACTATED RINGERS 5 % IV SOLN
INTRAVENOUS | Status: DC
  Administered 2011-10-26: 16:00:00 via INTRAVENOUS

## 2011-10-26 MED ORDER — OXYCODONE HCL 5 MG PO TABS
5.0000 mg | ORAL_TABLET | ORAL | Status: DC | PRN
  Administered 2011-10-26: 5 mg via ORAL

## 2011-10-26 MED ORDER — PROPOFOL 10 MG/ML IV EMUL
INTRAVENOUS | Status: DC | PRN
  Administered 2011-10-26: 150 mg via INTRAVENOUS

## 2011-10-26 MED ORDER — ONDANSETRON HCL 4 MG/2ML IJ SOLN
INTRAMUSCULAR | Status: DC | PRN
  Administered 2011-10-26: 4 mg via INTRAVENOUS

## 2011-10-26 MED ORDER — ACETAMINOPHEN 10 MG/ML IV SOLN
INTRAVENOUS | Status: DC | PRN
  Administered 2011-10-26: 1000 mg via INTRAVENOUS

## 2011-10-26 MED ORDER — GLYCOPYRROLATE 0.2 MG/ML IJ SOLN
INTRAMUSCULAR | Status: DC | PRN
  Administered 2011-10-26: .2 mg via INTRAVENOUS
  Administered 2011-10-26: .4 mg via INTRAVENOUS

## 2011-10-26 MED ORDER — LACTATED RINGERS IV SOLN
INTRAVENOUS | Status: DC
Start: 2011-10-26 — End: 2011-10-26

## 2011-10-26 MED ORDER — PROMETHAZINE HCL 25 MG/ML IJ SOLN
INTRAMUSCULAR | Status: AC
  Administered 2011-10-26: 12.5 mg via INTRAVENOUS
  Filled 2011-10-26: qty 1

## 2011-10-26 MED ORDER — FENTANYL CITRATE 0.05 MG/ML IJ SOLN
25.0000 ug | INTRAMUSCULAR | Status: DC | PRN
  Administered 2011-10-26: 50 ug via INTRAVENOUS
  Administered 2011-10-26 (×2): 25 ug via INTRAVENOUS

## 2011-10-26 MED ORDER — LACTATED RINGERS IV SOLN
INTRAVENOUS | Status: DC | PRN
  Administered 2011-10-26 (×2): via INTRAVENOUS

## 2011-10-26 MED ORDER — OXYCODONE-ACETAMINOPHEN 5-325 MG PO TABS: 1.0000 | ORAL_TABLET | ORAL | Status: AC | PRN

## 2011-10-26 SURGICAL SUPPLY — 41 items
APPLIER CLIP 5 13 M/L LIGAMAX5 (MISCELLANEOUS) ×2
APPLIER CLIP ROT 10 11.4 M/L (STAPLE)
BENZOIN TINCTURE PRP APPL 2/3 (GAUZE/BANDAGES/DRESSINGS) ×2 IMPLANT
CANISTER SUCTION 2500CC (MISCELLANEOUS) ×2 IMPLANT
CHLORAPREP W/TINT 26ML (MISCELLANEOUS) ×2 IMPLANT
CLIP APPLIE 5 13 M/L LIGAMAX5 (MISCELLANEOUS) ×1 IMPLANT
CLIP APPLIE ROT 10 11.4 M/L (STAPLE) IMPLANT
CLOTH BEACON ORANGE TIMEOUT ST (SAFETY) ×2 IMPLANT
COVER MAYO STAND STRL (DRAPES) IMPLANT
COVER SURGICAL LIGHT HANDLE (MISCELLANEOUS) IMPLANT
DECANTER SPIKE VIAL GLASS SM (MISCELLANEOUS) ×2 IMPLANT
DRAPE C-ARM 42X72 X-RAY (DRAPES) IMPLANT
DRAPE LAPAROSCOPIC ABDOMINAL (DRAPES) ×2 IMPLANT
DRAPE UTILITY XL STRL (DRAPES) ×2 IMPLANT
DRSG TEGADERM 2-3/8X2-3/4 SM (GAUZE/BANDAGES/DRESSINGS) ×8 IMPLANT
ELECT REM PT RETURN 9FT ADLT (ELECTROSURGICAL) ×2
ELECTRODE REM PT RTRN 9FT ADLT (ELECTROSURGICAL) ×1 IMPLANT
ENDOLOOP SUT PDS II  0 18 (SUTURE)
ENDOLOOP SUT PDS II 0 18 (SUTURE) IMPLANT
GLOVE BIOGEL PI IND STRL 7.0 (GLOVE) ×1 IMPLANT
GLOVE BIOGEL PI INDICATOR 7.0 (GLOVE) ×1
GLOVE ECLIPSE 8.0 STRL XLNG CF (GLOVE) ×2 IMPLANT
GLOVE INDICATOR 8.0 STRL GRN (GLOVE) ×4 IMPLANT
GOWN STRL NON-REIN LRG LVL3 (GOWN DISPOSABLE) ×2 IMPLANT
GOWN STRL REIN XL XLG (GOWN DISPOSABLE) ×4 IMPLANT
HEMOSTAT SURGICEL 4X8 (HEMOSTASIS) IMPLANT
IV CATH 14GX2 1/4 (CATHETERS) IMPLANT
KIT BASIN OR (CUSTOM PROCEDURE TRAY) ×2 IMPLANT
NS IRRIG 1000ML POUR BTL (IV SOLUTION) IMPLANT
POUCH SPECIMEN RETRIEVAL 10MM (ENDOMECHANICALS) IMPLANT
SET CHOLANGIOGRAPH MIX (MISCELLANEOUS) ×2 IMPLANT
SET IRRIG TUBING LAPAROSCOPIC (IRRIGATION / IRRIGATOR) ×2 IMPLANT
SOLUTION ANTI FOG 6CC (MISCELLANEOUS) ×2 IMPLANT
STRIP CLOSURE SKIN 1/2X4 (GAUZE/BANDAGES/DRESSINGS) ×2 IMPLANT
SUT MNCRL AB 4-0 PS2 18 (SUTURE) ×2 IMPLANT
TOWEL OR 17X26 10 PK STRL BLUE (TOWEL DISPOSABLE) ×2 IMPLANT
TRAY LAP CHOLE (CUSTOM PROCEDURE TRAY) ×2 IMPLANT
TROCAR BLADELESS OPT 5 75 (ENDOMECHANICALS) ×6 IMPLANT
TROCAR XCEL BLUNT TIP 100MML (ENDOMECHANICALS) ×2 IMPLANT
TROCAR XCEL NON-BLD 11X100MML (ENDOMECHANICALS) IMPLANT
TUBING INSUFFLATION 10FT LAP (TUBING) ×2 IMPLANT

## 2011-10-26 NOTE — Interval H&P Note (Signed)
History and Physical Interval Note:  10/26/2011 2:00 PM  Christina Ramsey  has presented today for surgery, with the diagnosis of symptomatic cholelithiasis  The various methods of treatment have been discussed with the patient and family. After consideration of risks, benefits and other options for treatment, the patient has consented to  Procedure(s): LAPAROSCOPIC CHOLECYSTECTOMY WITH INTRAOPERATIVE CHOLANGIOGRAM as a surgical intervention .  The patients' history has been reviewed, patient examined, no change in status, stable for surgery.  I have reviewed the patients' chart and labs.  Questions were answered to the patient's satisfaction.     Din Bookwalter Shela Commons

## 2011-10-26 NOTE — Preoperative (Signed)
Beta Blockers   Reason not to administer Beta Blockers:Not Applicable 

## 2011-10-26 NOTE — Anesthesia Postprocedure Evaluation (Signed)
Anesthesia Post Note  Patient: Christina Ramsey  Procedure(s) Performed:  LAPAROSCOPIC CHOLECYSTECTOMY WITH INTRAOPERATIVE CHOLANGIOGRAM  Anesthesia type: General  Patient location: PACU  Post pain: Pain level controlled  Post assessment: Post-op Vital signs reviewed  Last Vitals:  Filed Vitals:   10/26/11 1545  BP:   Pulse:   Temp: 36.4 C  Resp:     Post vital signs: Reviewed  Level of consciousness: sedated  Complications: No apparent anesthesia complications

## 2011-10-26 NOTE — Transfer of Care (Signed)
Immediate Anesthesia Transfer of Care Note  Patient: Christina Ramsey  Procedure(s) Performed:  LAPAROSCOPIC CHOLECYSTECTOMY WITH INTRAOPERATIVE CHOLANGIOGRAM  Patient Location: PACU  Anesthesia Type: General  Level of Consciousness: awake, oriented, patient cooperative and responds to stimulation  Airway & Oxygen Therapy: Patient Spontanous Breathing and Patient connected to face mask oxygen  Post-op Assessment: Report given to PACU RN, Post -op Vital signs reviewed and stable and Patient moving all extremities  Post vital signs: Reviewed and stable  Complications: No apparent anesthesia complications

## 2011-10-26 NOTE — Anesthesia Preprocedure Evaluation (Addendum)
Anesthesia Evaluation  Patient identified by MRN, date of birth, ID band Patient awake    Reviewed: Allergy & Precautions, H&P , NPO status , Patient's Chart, lab work & pertinent test results  Airway Mallampati: I TM Distance: >3 FB Neck ROM: full    Dental No notable dental hx. (+) Teeth Intact and Dental Advidsory Given   Pulmonary neg pulmonary ROS,  clear to auscultation  Pulmonary exam normal       Cardiovascular Exercise Tolerance: Good neg cardio ROS regular Normal    Neuro/Psych Negative Neurological ROS  Negative Psych ROS   GI/Hepatic negative GI ROS, Neg liver ROS,   Endo/Other  Negative Endocrine ROS  Renal/GU negative Renal ROS  Genitourinary negative   Musculoskeletal   Abdominal Normal abdominal exam  (+)   Peds  Hematology negative hematology ROS (+)   Anesthesia Other Findings   Reproductive/Obstetrics negative OB ROS                           Anesthesia Physical Anesthesia Plan  ASA: I  Anesthesia Plan: General ETT   Post-op Pain Management:    Induction:   Airway Management Planned:   Additional Equipment:   Intra-op Plan:   Post-operative Plan:   Informed Consent: I have reviewed the patients History and Physical, chart, labs and discussed the procedure including the risks, benefits and alternatives for the proposed anesthesia with the patient or authorized representative who has indicated his/her understanding and acceptance.   Dental Advisory Given  Plan Discussed with: CRNA  Anesthesia Plan Comments:         Anesthesia Quick Evaluation

## 2011-10-26 NOTE — Op Note (Signed)
Preoperative diagnosis:  Symptomatic cholelithiasis  Postoperative diagnosis:  Same  Procedure: Laparoscopic cholecystectomy with cholangiogram.  Surgeon: Avel Peace, M.D.  Asst.:  Harden Mo, M.D.  Anesthesia: Gen.  Indication:   This is a 40 year old female with biliary colic and cholelithiasis. I saw her in the emergency department last month after she had a self-limited episode. She now presents for elective laparoscopic cholecystectomy.  The procedure, risks, and after care were discussed with her preoperatively.  Technique: The patient was brought to the operating room, placed supine on the operating table, and a general anesthetic was administered. The hair on the abdominal wall was clipped as was necessary. The abdominal wall was then sterilely prepped and draped. Local anesthetic (Marcaine) was infiltrated in the supraumbilical region. A small supraumbilical incision was made, at the site of a previous scar, through the skin, subcutaneous tissue, fascia, and peritoneum entering the peritoneal cavity under direct vision. A pursestring suture of 0 Vicryl was placed around the edges of the fascia. A Hassan trocar was introduced into the peritoneal cavity and a pneumoperitoneum was created by insufflation of carbon dioxide gas. The laparoscope was introduced into the trocar and no underlying bleeding or organ injury was noted. The patient was then placed in the reverse Trendelenburg position with the right side tilted slightly up.  Three more trochars were then placed into the abdominal cavity under laparoscopic vision. One in the epigastric area, and 2 in the right upper quadrant area. The gallbladder was visualized.  Adhesions between the infundibulum of the gallbladder and duodenum were separated bluntly. The fundus was grasped and retracted toward the right shoulder.  The infundibulum was mobilized with dissection close to the gallbladder and retracted laterally. The cystic duct was  identified and a window was created around it. The cystic artery was also identified and a window was created around it. The critical view was achieved. A clip was placed at the neck of the gallbladder. A small incision was made in the cystic duct. A cholangiocatheter was introduced through the anterior abdominal wall and placed in the cystic duct. A intraoperative cholangiogram was then performed.  Under real-time fluoroscopy, dilute contrast was injected into the cystic duct.  The common hepatic duct, the right and left hepatic ducts, and the common duct were all visualized. Contrast drained into the duodenum without obvious evidence of any obstructing ductal lesion. The final report is pending the Radiologist's interpretation.  The cholangiocatheter was removed, the cystic duct was clipped 3 times on the biliary side, and then the cystic duct was divided sharply. No bile leak was noted from the cystic duct stump.  The cystic artery was then clipped and divided. Following this the gallbladder was dissected free from the liver using electrocautery. The gallbladder was then placed in a retrieval bag and removed from the abdominal cavity through the supraumbilical incision.  The gallbladder fossa was inspected, irrigated, and bleeding was controlled with electrocautery. Inspection showed that hemostasis was adequate and there was no evidence of bile leak.  The irrigation fluid was evacuated as much as possible.  The supraumbilical trocar was removed and the fascial defect was closed by tightening and tying down the pursestring suture under laparoscopic vision.  The remaining trochars were removed and the pneumoperitoneum was released. The skin incisions were closed with 4-0 Monocryl subcuticular stitches. Steri-Strips and sterile dressings were applied.  The procedure was well-tolerated without any apparent complications. The patient was taken to the recovery room in satisfactory condition.

## 2011-10-26 NOTE — H&P (View-Only) (Signed)
Reason for Consult:abdominal pain and gallstones Referring Physician: Dr. Knox Royalty  Christina Ramsey is an 40 y.o. female.  HPI:   She had an episode of pressure type mid sternal pain and nausea last month that resolved spontaneously. 3 days ago she had a severe case of epigastric pain with nausea and vomiting. She went to urgent care and was seen by Dr. Knox Royalty.  Lab work was done and demonstrated mild elevation of some of her liver function tests. She underwent ultrasound today demonstrating gallstones but no gallbladder wall thickening or pericholecystic fluid. No dilated common bile duct. There was concern that she might have acute cholecystitis and we were asked to see here. She was directed to the emergency department at [facility] for further evaluation and treatment.  No fever or chills. No diarrhea.  Past Medical History  Diagnosis Date  . No active medical problems     Past Surgical History  Procedure Date  . Cesarean section   . Cosmetic surgery 04/2005    abdominoplasty/breast augmentation  . Augmentation mammaplasty 04/2005  . Abdominal surgery 04/2005    abdominoplasty  . Cesarean section 08/2007    Allergies: No Known Allergies  Medications: I have reviewed the patient's current medications.  No family history on file.  Social History:  has an unknown smoking status. She does not have any smokeless tobacco history on file. She reports that she does not drink alcohol or use illicit drugs.  ROS General:  Negative  Breast:  Negative  Infectious Diseases: Negative  Cardiac  :  Negative  Pulmonary:  Negative  Endocrine:  Negative  Skin:  Negative  Gastrointestinal:  Negative  Genitourinary:  Negative  Neurological:  Negative  Hematologic/Lymphatic:  Negative  HEENT:  Negative  Musculoskeletal:  Negative   Blood pressure 115/78, pulse 83, temperature 98.5 F (36.9 C), temperature source Oral, resp. rate 20, last menstrual period 08/30/2011, SpO2  100.00%.  BP 115/78  Pulse 83  Temp(Src) 98.5 F (36.9 C) (Oral)  Resp 20  SpO2 100%  LMP 08/30/2011  PE        General Appearance:  Alert, cooperative, no distress, appears stated age  Head:  Normocephalic, without obvious abnormality, atraumatic  Eyes:   Conjunctiva/corneas clear, EOM's intact      Nose: Nares normal, no drainage   Mouth: Mucous membranes moist  Neck: Supple, symmetrical, trachea midline, no tenderness/mass/nodules, no JVD  Back:   Symmetric, no curvature, no CVA tenderness  Lungs:   Clear to auscultation bilaterally, respirations unlabored  Chest Wall:  No tenderness or deformity  Heart:  Regular rate and rhythm, S1, S2 normal, no murmur, rub or gallop  Abdomen:   Soft, non-tender, bowel sounds active all four quadrants,  no masses, no organomegaly, lower midline and periumbilical scars.          Extremities: Extremities normal, atraumatic, no cyanosis or edema     Skin: Skin color, texture, turgor normal, no rashes or lesions  Lymph nodes: No enlarged cervical or supraclavicular nodes                                  Neuro:  Alert and oriented, no focal deficits          Results for orders placed during the hospital encounter of 09/28/11 (from the past 48 hour(s))  CBC     Status: Normal   Collection Time   09/28/11  5:30 PM  Component Value Range Comment   WBC 7.4  4.0 - 10.5 (K/uL)    RBC 4.98  3.87 - 5.11 (MIL/uL)    Hemoglobin 14.4  12.0 - 15.0 (g/dL)    HCT 16.1  09.6 - 04.5 (%)    MCV 84.7  78.0 - 100.0 (fL)    MCH 28.9  26.0 - 34.0 (pg)    MCHC 34.1  30.0 - 36.0 (g/dL)    RDW 40.9  81.1 - 91.4 (%)    Platelets 270  150 - 400 (K/uL)   COMPREHENSIVE METABOLIC PANEL     Status: Abnormal   Collection Time   09/28/11  5:30 PM      Component Value Range Comment   Sodium 136  135 - 145 (mEq/L)    Potassium 3.9  3.5 - 5.1 (mEq/L)    Chloride 99  96 - 112 (mEq/L)    CO2 30  19 - 32 (mEq/L)    Glucose, Bld 77  70 - 99 (mg/dL)    BUN 8   6 - 23 (mg/dL)    Creatinine, Ser 7.82  0.50 - 1.10 (mg/dL)    Calcium 9.4  8.4 - 10.5 (mg/dL)    Total Protein 7.1  6.0 - 8.3 (g/dL)    Albumin 4.0  3.5 - 5.2 (g/dL)    AST 19  0 - 37 (U/L)    ALT 46 (*) 0 - 35 (U/L)    Alkaline Phosphatase 80  39 - 117 (U/L)    Total Bilirubin 0.4  0.3 - 1.2 (mg/dL)    GFR calc non Af Amer 88 (*) >90 (mL/min)    GFR calc Af Amer >90  >90 (mL/min)   LIPASE, BLOOD     Status: Normal   Collection Time   09/28/11  5:30 PM      Component Value Range Comment   Lipase 22  11 - 59 (U/L)     US Abdomen Complete  09/28/2011  *RADIOLOGY REPORT*  Clinical Data:  Right upper quadrant pain, elevated LFTs  ABDOMINAL ULTRASOUND COMPLETE  Comparison:  None.  Findings:  Gallbladder:  Numerous echogenic shadowing layering gallstones. Despite this, there is no elicited Murphy's sign, wall thickening or pericholecystic fluid.  No signs of cholecystitis.  Wall thickness measures 1.9 mm.  Common Bile Duct:  Within normal limits in caliber.  Liver: Normal echogenicity.  Incidental right posterior hepatic lobe 11 mm cyst beneath the diaphragm identified.  No biliary dilatation.  IVC:  Appears normal.  Pancreas:  No abnormality identified.  Spleen:  Within normal limits in size and echotexture.  Right kidney:  Normal in size and parenchymal echogenicity.  No evidence of mass or hydronephrosis.  Left kidney:  Normal in size and parenchymal echogenicity.  No evidence of mass or hydronephrosis.  Abdominal Aorta:  No aneurysm identified.  IMPRESSION: Cholelithiasis.  No ultrasound evidence of acute cholecystitis.  Incidental 11 mm hepatic cyst.  Original Report Authenticated By: Judie Petit. Ruel Favors, M.D.    Assessment/Plan: Acute biliary colic secondary to cholelithiasis that has resolved. No evidence of acute cholecystitis.  Plan: Discharge emergency department. Strict low-fat nonfat diet. Schedule elective outpatient  Laparoscopic cholecystectomy.  I have explained the procedure, risks,  and aftercare of cholecystectomy.  Risks include but are not limited to bleeding, infection, wound problems, anesthesia, diarrhea, bile leak, injury to common bile duct/liver/intestine.  She seems to understand and agrees to proceed.   Ebrahim Deremer J 09/28/2011, 7:02 PM

## 2011-10-27 ENCOUNTER — Encounter (HOSPITAL_COMMUNITY): Payer: Self-pay | Admitting: General Surgery

## 2011-11-21 DIAGNOSIS — N809 Endometriosis, unspecified: Secondary | ICD-10-CM

## 2011-11-21 HISTORY — DX: Endometriosis, unspecified: N80.9

## 2011-11-22 ENCOUNTER — Telehealth (INDEPENDENT_AMBULATORY_CARE_PROVIDER_SITE_OTHER): Payer: Self-pay | Admitting: General Surgery

## 2011-11-22 NOTE — Telephone Encounter (Signed)
Pt called in stated she was confirming post op and also stated when she lays down on her side it feels tight and pinches. York Spaniel it feels like it is pulling. She is not sure if she has developed scar tissue or if it is normal. She stated she gets nauseated when she eats certain foods, but she is staying away from those foods. I confirmed appointment for 11/27/11 and suggested she stay away from oily and spicy foods as well.

## 2011-11-27 ENCOUNTER — Encounter (INDEPENDENT_AMBULATORY_CARE_PROVIDER_SITE_OTHER): Payer: Self-pay | Admitting: General Surgery

## 2011-11-27 ENCOUNTER — Ambulatory Visit (INDEPENDENT_AMBULATORY_CARE_PROVIDER_SITE_OTHER): Payer: Managed Care, Other (non HMO) | Admitting: General Surgery

## 2011-11-27 VITALS — BP 108/70 | HR 76 | Temp 98.4°F | Resp 12 | Ht 62.0 in | Wt 145.0 lb

## 2011-11-27 DIAGNOSIS — Z09 Encounter for follow-up examination after completed treatment for conditions other than malignant neoplasm: Secondary | ICD-10-CM

## 2011-11-27 NOTE — Progress Notes (Signed)
She is here for a postop visit following laparoscopic cholecystectomy.  Diet is being tolerated, bowels are moving.  No problems with incisions.  She is having some periumbilical soreness at times.  PE:  ABD:  Soft, incisions clean/dry/intact and solid.  Assessment:  Doing well postop.  Some periumbilical soreness which likely will resolve over time.  Plan:  Lowfat diet recommended.  Activities as tolerated.  Return visit prn.

## 2011-11-27 NOTE — Patient Instructions (Signed)
Low fat diet. Activities as tolerated. 

## 2012-07-10 ENCOUNTER — Encounter: Payer: Self-pay | Admitting: Gynecology

## 2012-07-10 ENCOUNTER — Other Ambulatory Visit (HOSPITAL_COMMUNITY)
Admission: RE | Admit: 2012-07-10 | Discharge: 2012-07-10 | Disposition: A | Discharge status: Home / Self Care | Payer: Managed Care, Other (non HMO) | Source: Ambulatory Visit | Attending: Gynecology | Admitting: Gynecology

## 2012-07-10 ENCOUNTER — Ambulatory Visit (INDEPENDENT_AMBULATORY_CARE_PROVIDER_SITE_OTHER): Payer: Managed Care, Other (non HMO) | Admitting: Gynecology

## 2012-07-10 VITALS — BP 106/74 | Ht 61.0 in | Wt 154.0 lb

## 2012-07-10 DIAGNOSIS — Z01419 Encounter for gynecological examination (general) (routine) without abnormal findings: Secondary | ICD-10-CM

## 2012-07-10 DIAGNOSIS — IMO0002 Reserved for concepts with insufficient information to code with codable children: Secondary | ICD-10-CM

## 2012-07-10 DIAGNOSIS — R109 Unspecified abdominal pain: Secondary | ICD-10-CM

## 2012-07-10 DIAGNOSIS — Z1151 Encounter for screening for human papillomavirus (HPV): Secondary | ICD-10-CM | POA: Insufficient documentation

## 2012-07-10 NOTE — Progress Notes (Signed)
Christina Ramsey [birth year] [id number]        41 y.o.  [id number] for annual exam.  Several issues noted below.  Past medical history,surgical history, medications, allergies, family history and social history were all reviewed and documented in the EPIC chart. ROS:  Was performed and pertinent positives and negatives are included in the history.  Exam: Christina Ramsey assistant Filed Vitals:   07/10/12 0900  BP: 106/74  Height: 5\' 1"  (1.549 m)  Weight: 154 lb (69.854 kg)   General appearance  Normal Skin grossly normal Head/Neck normal with no cervical or supraclavicular adenopathy thyroid normal Lungs  clear Cardiac RR, without RMG Abdominal  soft, mild suprapubic tenderness, without masses, organomegaly or hernia Breasts  examined lying and sitting without masses, retractions, discharge or axillary adenopathy. Pelvic  Ext/BUS/vagina  normal   Cervix  normal IUD string visualized, Pap/HPV  Uterus  anteverted, normal size, shape and contour, midline and mobile nontender   Adnexa  Without masses or tenderness    Anus and perineum  normal   Rectovaginal  normal sphincter tone without palpated masses or tenderness.    Assessment/Plan:  41 y.o. [id number] female for annual exam.   1. Mirena IUD. Placed 04/19/2011. History of menorrhagia irregular menses. Now with monthly light flows. Doing well. 2. Dyspareunia/pelvic pain. Patient had suprapubic pelvic pain with last coital episode. Also some diffuse discomfort on exam today. We'll check urinalysis rule out UTI and baseline ultrasound rule out ovarian disease. Assuming both negative we'll monitor and if pain resolves we'll follow. If it persists worsens or changes discussed other possibilities to include laparoscopy. 3. Pap smear. Last Pap smear 2012 normal. Pap/HPV done today. No history of abnormal Pap smears and if this Pap smear is normal will plan every 5 year screening. 4. Mammography. Patients that had a mammogram and I recommended baseline this  year as she is 41. Patient agrees to schedule. SBE monthly reviewed. 5. Health maintenance. CBC, lipid profile, glucose all normal last year and I did not repeat them this year. Follow up for ultrasound.    Dara Lords MD, 9:58 AM 07/10/2012

## 2012-07-10 NOTE — Patient Instructions (Signed)
Schedule baseline screening mammogram. Follow up for ultrasound in our office as scheduled.

## 2012-07-11 ENCOUNTER — Emergency Department (HOSPITAL_COMMUNITY)
Admission: EM | Admit: 2012-07-11 | Discharge: 2012-07-11 | Disposition: A | Discharge status: Home / Self Care | Payer: Managed Care, Other (non HMO) | Attending: Emergency Medicine | Admitting: Emergency Medicine

## 2012-07-11 ENCOUNTER — Encounter (HOSPITAL_COMMUNITY): Payer: Self-pay | Admitting: Emergency Medicine

## 2012-07-11 DIAGNOSIS — IMO0002 Reserved for concepts with insufficient information to code with codable children: Secondary | ICD-10-CM | POA: Insufficient documentation

## 2012-07-11 DIAGNOSIS — Z87891 Personal history of nicotine dependence: Secondary | ICD-10-CM | POA: Insufficient documentation

## 2012-07-11 DIAGNOSIS — W57XXXA Bitten or stung by nonvenomous insect and other nonvenomous arthropods, initial encounter: Secondary | ICD-10-CM

## 2012-07-11 LAB — URINALYSIS W MICROSCOPIC + REFLEX CULTURE
Bilirubin Urine: NEGATIVE
Hgb urine dipstick: NEGATIVE
Leukocytes, UA: NEGATIVE
Protein, ur: NEGATIVE mg/dL
Squamous Epithelial / LPF: NONE SEEN
Urobilinogen, UA: 0.2 mg/dL (ref 0.0–1.0)

## 2012-07-11 MED ORDER — CEPHALEXIN 500 MG PO CAPS: 500.0000 mg | ORAL_CAPSULE | Freq: Four times a day (QID) | ORAL | Status: AC

## 2012-07-11 MED ORDER — CEPHALEXIN 250 MG PO CAPS
250.0000 mg | ORAL_CAPSULE | Freq: Once | ORAL | Status: AC
  Administered 2012-07-11: 250 mg via ORAL
  Filled 2012-07-11: qty 1

## 2012-07-11 NOTE — ED Notes (Signed)
Pt has bite to right foot at 2nd toe that is red and inflamed and moderately painful. 5/10

## 2012-07-11 NOTE — ED Provider Notes (Signed)
History     CSN: [id number]  Arrival date & time 07/11/12  2217   First MD Initiated Contact with Patient 07/11/12 2236      No chief complaint on file.   (Consider location/radiation/quality/duration/timing/severity/associated sxs/prior treatment) HPI  41 y.o. female in no acute distress complaining of possible spider bite to right foot. Patient denies any fever nausea vomiting. Pain is mild 2/10 exacerbated with movement.    Past Medical History  Diagnosis Date  . No active medical problems     Past Surgical History  Procedure Date  . Augmentation mammaplasty 04/2005  . Abdominal surgery 04/2005    abdominoplasty  . Cholecystectomy 10/26/2011    Procedure: LAPAROSCOPIC CHOLECYSTECTOMY WITH INTRAOPERATIVE CHOLANGIOGRAM;  Surgeon: Adolph Pollack, MD;  Location: [facility] ORS;  Service: General;  Laterality: N/A;  . Cesarean section 08/2007  . Intrauterine device insertion 04/19/2011    Mirena    No family history on file.  History  Substance Use Topics  . Smoking status: Former Smoker -- 1 years    Types: Cigarettes    Quit date: 10/24/1981  . Smokeless tobacco: Never Used  . Alcohol Use: No     socially in high school    OB History    Grav Para Term Preterm Abortions TAB SAB Ect Mult Living   5 3   2  2   3       Review of Systems  Skin: Positive for wound.  All other systems reviewed and are negative.    Allergies  Review of patient's allergies indicates no known allergies.  Home Medications   Current Outpatient Rx  Name Route Sig Dispense Refill  . CIPROFLOXACIN HCL 500 MG PO TABS Oral Take 500 mg by mouth 2 (two) times daily.    Marland Kitchen LORATADINE 10 MG PO TABS Oral Take 10 mg by mouth daily.      BP 137/93  Pulse 75  Temp 98.6 F (37 C)  Resp 18  SpO2 100%  LMP 06/18/2012  Physical Exam  Nursing note and vitals reviewed. Constitutional: She is oriented to person, place, and time. She appears well-developed and well-nourished. No distress.  HENT:   Head: Normocephalic.  Eyes: Conjunctivae and EOM are normal.  Cardiovascular: Normal rate.   Pulmonary/Chest: Effort normal.  Musculoskeletal: Normal range of motion.  Neurological: She is alert and oriented to person, place, and time.  Skin:       1 cm of erythema to dorsum of left foot with to pinpoint puncture wounds to same. No signs of infection. No warmth tenderness induration or purulent discharge.  Psychiatric: She has a normal mood and affect.    ED Course  Procedures (including critical care time)  Labs Reviewed - No data to display No results found.   1. Insect bite       MDM  An non-infected insect bite to left foot. Will give the patient a PRN prescription for Keflex to fill and use when and if she see signs of infection.        Wynetta Emery, PA-C 07/11/12 2255

## 2012-07-11 NOTE — ED Provider Notes (Signed)
Medical screening examination/treatment/procedure(s) were performed by non-physician practitioner and as supervising physician I was immediately available for consultation/collaboration.    Nelia Shi, MD 07/11/12 2255

## 2012-07-12 ENCOUNTER — Ambulatory Visit (INDEPENDENT_AMBULATORY_CARE_PROVIDER_SITE_OTHER): Payer: Managed Care, Other (non HMO) | Admitting: Gynecology

## 2012-07-12 ENCOUNTER — Encounter: Payer: Self-pay | Admitting: Gynecology

## 2012-07-12 ENCOUNTER — Ambulatory Visit (INDEPENDENT_AMBULATORY_CARE_PROVIDER_SITE_OTHER): Payer: Managed Care, Other (non HMO)

## 2012-07-12 ENCOUNTER — Telehealth: Payer: Self-pay | Admitting: *Deleted

## 2012-07-12 DIAGNOSIS — IMO0002 Reserved for concepts with insufficient information to code with codable children: Secondary | ICD-10-CM

## 2012-07-12 DIAGNOSIS — R102 Pelvic and perineal pain: Secondary | ICD-10-CM

## 2012-07-12 DIAGNOSIS — N949 Unspecified condition associated with female genital organs and menstrual cycle: Secondary | ICD-10-CM

## 2012-07-12 DIAGNOSIS — R109 Unspecified abdominal pain: Secondary | ICD-10-CM

## 2012-07-12 DIAGNOSIS — N83209 Unspecified ovarian cyst, unspecified side: Secondary | ICD-10-CM

## 2012-07-12 NOTE — Telephone Encounter (Signed)
Pt has ultrasound scheduled this am 11:30, pt asked if any direction. Pt informed:Drink 32 oz of water 1 hour prior tp appt. time and hold bladder.DO NOT VOID- Bladder must be full at time of study.

## 2012-07-12 NOTE — Progress Notes (Signed)
Patient presents for ultrasound with history of pelvic discomfort and dyspareunia.  Ultrasound shows uterus normal size and echotexture. Endometrium 7.7 mm. IUD visualized within the cavity. Left ovary normal. Right ovary with echo-free thin-walled avascular cyst 33 x 28 mm. Trace of fluid in the cul-de-sac.  Assessment and plan: Probable physiologic cyst right ovary. Patient notes that her pelvic discomfort has resolved. Recommend re\re ultrasound in 3 months just to make sure the cyst resolves and then otherwise assuming she is pain-free follow expectantly. If pain would become recurrent or cyst persists or enlarges then consider laparoscopy. Patient's comfortable with the plan and will follow up in 3 months.

## 2012-07-12 NOTE — Patient Instructions (Signed)
Follow for ultrasound in 3 months. Follow up sooner if recurrent pain.

## 2012-07-18 ENCOUNTER — Telehealth: Payer: Self-pay | Admitting: Gynecology

## 2012-07-18 MED ORDER — FLUCONAZOLE 150 MG PO TABS: 150.0000 mg | ORAL_TABLET | Freq: Once | ORAL | Status: AC

## 2012-07-18 NOTE — Telephone Encounter (Signed)
Pt informed with the below. 

## 2012-07-18 NOTE — Telephone Encounter (Signed)
Left message on pt voicemail

## 2012-07-18 NOTE — Telephone Encounter (Signed)
Tell patient Pap smear is normal but does show yeast. I will cover her with Diflucan 150x1 dose.

## 2012-07-24 ENCOUNTER — Encounter: Payer: Self-pay | Admitting: Gynecology

## 2012-10-09 ENCOUNTER — Other Ambulatory Visit: Payer: Managed Care, Other (non HMO)

## 2012-10-09 ENCOUNTER — Ambulatory Visit (INDEPENDENT_AMBULATORY_CARE_PROVIDER_SITE_OTHER): Payer: Managed Care, Other (non HMO) | Admitting: Gynecology

## 2012-10-09 ENCOUNTER — Ambulatory Visit (INDEPENDENT_AMBULATORY_CARE_PROVIDER_SITE_OTHER): Payer: Managed Care, Other (non HMO)

## 2012-10-09 ENCOUNTER — Ambulatory Visit: Payer: Managed Care, Other (non HMO) | Admitting: Gynecology

## 2012-10-09 ENCOUNTER — Encounter: Payer: Self-pay | Admitting: Gynecology

## 2012-10-09 DIAGNOSIS — N83209 Unspecified ovarian cyst, unspecified side: Secondary | ICD-10-CM

## 2012-10-09 DIAGNOSIS — N83201 Unspecified ovarian cyst, right side: Secondary | ICD-10-CM

## 2012-10-09 DIAGNOSIS — N949 Unspecified condition associated with female genital organs and menstrual cycle: Secondary | ICD-10-CM

## 2012-10-09 DIAGNOSIS — R102 Pelvic and perineal pain: Secondary | ICD-10-CM

## 2012-10-09 DIAGNOSIS — N839 Noninflammatory disorder of ovary, fallopian tube and broad ligament, unspecified: Secondary | ICD-10-CM

## 2012-10-09 DIAGNOSIS — N8300 Follicular cyst of ovary, unspecified side: Secondary | ICD-10-CM

## 2012-10-09 NOTE — Progress Notes (Signed)
Patient presents for follow up ultrasound.  She had an ultrasound in August due to dyspareunia and pelvic discomfort which showed a 30 mm mean right ovarian avascular cyst.  She was asked to follow up for repeat ultrasound in several months. She knows in the interim her pain has continued almost daily with pelvic discomfort and consistent deep dyspareunia.  Ultrasound shows uterus normal with IUD in normal position. Endometrial echo 4.3 mm. Left ovary normal. Right ovary with continued presence of echo-free avascular cyst 32 mm mean with a thin septum. Some echotexture to suggest endometrioma.  Assessment and plan: Persistent right ovarian cyst suggestive of endometrioma with deep dyspareunia and pelvic discomfort. Mirena IUD in place.  Options for management reviewed to include observation versus surgery. Was involved with laparoscopic surgery to include general anesthesia, multiple port sites insufflation and recovery. The risks to include damage to internal organs including bowel bladder ureters vessels and nerves, hemorrhage necessitating transfusion, infection and wound complications discussed. Patient wants to schedule schedule this at her convenience.  The patient did have a laparoscopic cholecystectomy a year ago and there is no mention as to her pelvic anatomy.

## 2012-10-09 NOTE — Patient Instructions (Signed)
Office will call you to arrange surgery. 

## 2012-10-11 ENCOUNTER — Telehealth: Payer: Self-pay | Admitting: Gynecology

## 2012-10-11 NOTE — Telephone Encounter (Signed)
Left message for patient to call me regarding scheduling surgery. 

## 2012-10-11 NOTE — Telephone Encounter (Signed)
I spoke with patient and scheduled her surgery for Th Dec 12 1:00pm at [facility].  Preop cons 10/25/12.

## 2012-10-19 ENCOUNTER — Encounter (HOSPITAL_COMMUNITY): Payer: Self-pay | Admitting: Pharmacist

## 2012-10-24 ENCOUNTER — Encounter (HOSPITAL_COMMUNITY): Payer: Self-pay

## 2012-10-24 ENCOUNTER — Encounter (HOSPITAL_COMMUNITY)
Admission: RE | Admit: 2012-10-24 | Discharge: 2012-10-24 | Disposition: A | Discharge status: Home / Self Care | Payer: Managed Care, Other (non HMO) | Source: Ambulatory Visit | Attending: Gynecology | Admitting: Gynecology

## 2012-10-24 HISTORY — DX: Headache: R51

## 2012-10-24 LAB — SURGICAL PCR SCREEN
MRSA, PCR: NEGATIVE
Staphylococcus aureus: NEGATIVE

## 2012-10-24 LAB — CBC
Hemoglobin: 14.7 g/dL (ref 12.0–15.0)
MCH: 30 pg (ref 26.0–34.0)
RBC: 4.9 MIL/uL (ref 3.87–5.11)
WBC: 8.4 10*3/uL (ref 4.0–10.5)

## 2012-10-24 NOTE — Patient Instructions (Addendum)
Your procedure is scheduled on: 10/31/12  Enter through the Main Entrance at :1130 am Pick up desk phone and dial [phone] and inform us of your arrival.  Please call [phone] if you have any problems the morning of surgery.  Remember: Do not eat after midnight:WED.  Clear liquids ok until 9am Thursday Take these meds the morning of surgery with a sip of water:none  DO NOT wear jewelry, eye make-up, lipstick,body lotion, or dark fingernail polish. Do not shave for 48 hours prior to surgery.  If you are to be admitted after surgery, leave suitcase in car until your room has been assigned. Patients discharged on the day of surgery will not be allowed to drive home.

## 2012-10-25 ENCOUNTER — Ambulatory Visit (INDEPENDENT_AMBULATORY_CARE_PROVIDER_SITE_OTHER): Payer: Managed Care, Other (non HMO) | Admitting: Gynecology

## 2012-10-25 ENCOUNTER — Encounter: Payer: Self-pay | Admitting: Gynecology

## 2012-10-25 DIAGNOSIS — IMO0002 Reserved for concepts with insufficient information to code with codable children: Secondary | ICD-10-CM

## 2012-10-25 DIAGNOSIS — R102 Pelvic and perineal pain: Secondary | ICD-10-CM

## 2012-10-25 DIAGNOSIS — N949 Unspecified condition associated with female genital organs and menstrual cycle: Secondary | ICD-10-CM

## 2012-10-25 NOTE — Progress Notes (Signed)
Christina Ramsey 1971-10-19 [id number]   Preoperative consult  Chief complaint: pelvic pain and dyspareunia  History of present illness: 41 y.o. [id number] Mirena IUD with consistent deep dyspareunia with every coital episode and diffuse pelvic discomfort almost on a daily basis as cramping to age and feeling. Exam is normal with ultrasound showing a persistent right ovarian 32 mm cyst consistent with an endometrioma. Patient is admitted for laparoscopic right ovarian cystectomy and treatment of any other pelvic pathology as possible.  Past medical history,surgical history, medications, allergies, family history and social history were all reviewed and documented in the EPIC chart. ROS:  Was performed and pertinent positives and negatives are included in the history of present illness.  Exam:  Sherrilyn Rist assistant General: well developed, well nourished female, no acute distress HEENT: normal  Lungs: clear to auscultation without wheezing, rales or rhonchi  Cardiac: regular rate without rubs, murmurs or gallops  Abdomen: soft, nontender without masses, guarding, rebound, organomegaly  Pelvic: external bus vagina: normal   Cervix: grossly normal  Uterus: normal size, midline and mobile, nontender  Adnexa: without masses or tenderness      Assessment/Plan:  41 y.o. [id number] Mirena IUD with persistent deep dyspareunia and pelvic discomfort. Ultrasound consistent with right endometrioma.  Patients admitted for laparoscopic right ovarian cystectomy and treatment of any encountered pathology such as peritoneal endometriosis or adhesions. I reviewed the proposed surgery with the patient to include the expected intraoperative postoperative courses, trocar placement multiple port sites insufflation use of sharp blunt dissection electrocautery laser and harmonic scalpel. I reviewed with her that there are no guarantees as far as pain relief and that her pain may persist worsen or change following the procedure. I  also reviewed with her the possibility of salpingo-oophorectomy if extensive involvement of the ovary with endometriosis or if complications such as bleeding arise and I must sacrifice the blood supply to the ovary that I would proceed with a salpingo-oophorectomy.  I reviewed that if extensive endometriosis is encountered such as a "socked in pelvis" that I may abort the procedure at that point and we would discuss treatment options postoperatively. She does have a Mirena IUD in place I discussed the risk of uterine manipulation during the procedure and possibly causing her to dislodge or expel the IUD.  The risks of infection, prolonged antibiotics, hemorrhage necessitating transfusion and the risks of transfusion including transfusion reaction hepatitis HIV mad cow disease and other unknown entities was reviewed. Incisional complications requiring opening and draining of incisions, closure by secondary intention and long-term issues of cosmetics/keloid formation and hernia formation were all reviewed with her. The risk of inadvertent injury to internal organs particularly with prior surgery such as bowel bladder ureters vessels and nerves either immediately recognized or delay recognized necessitating major exploratory reparative surgeries and future reparative surgeries including ostomy formation bowel resection bladder repair ureteral damage repair was all discussed understood and accepted. The patient's questions were answered to her satisfaction she is ready to proceed with surgery.    Dara Lords MD, 12:45 PM 10/25/2012

## 2012-10-25 NOTE — Patient Instructions (Signed)
Followup for surgery as scheduled. 

## 2012-10-25 NOTE — H&P (Addendum)
  Senita Corredor 10/05/1971 [id number]   History and Physical  Chief complaint: pelvic pain and dyspareunia  History of present illness: 41 y.o. [id number] Mirena IUD with consistent deep dyspareunia with every coital episode and diffuse pelvic discomfort almost on a daily basis as described as cramping. Exam is normal with ultrasound showing a persistent right ovarian 32 mm cyst consistent with an endometrioma. Patient is admitted for laparoscopic right ovarian cystectomy and treatment of any other pelvic pathology as possible.  Past medical history,surgical history, medications, allergies, family history and social history were all reviewed and documented in the EPIC chart. ROS:  Was performed and pertinent positives and negatives are included in the history of present illness.  Exam:  Sherrilyn Rist assistant General: well developed, well nourished female, no acute distress HEENT: normal  Lungs: clear to auscultation without wheezing, rales or rhonchi  Cardiac: regular rate without rubs, murmurs or gallops  Abdomen: soft, nontender without masses, guarding, rebound, organomegaly  Pelvic: external bus vagina: normal   Cervix: grossly normal  Uterus: normal size, midline and mobile, nontender  Adnexa: without masses or tenderness      Assessment/Plan:  41 y.o. [id number] Mirena IUD with persistent deep dyspareunia and pelvic discomfort. Ultrasound consistent with right endometrioma.  Patients admitted for laparoscopic right ovarian cystectomy and treatment of any encountered pathology such as peritoneal endometriosis or adhesions. I reviewed the proposed surgery with the patient to include the expected intraoperative postoperative courses, trocar placement multiple port sites insufflation use of sharp blunt dissection electrocautery laser and harmonic scalpel. I reviewed with her that there are no guarantees as far as pain relief and that her pain may persist worsen or change following the procedure. I  also reviewed with her the possibility of salpingo-oophorectomy if extensive involvement of the ovary with endometriosis or if complications such as bleeding arise and I must sacrifice the blood supply to the ovary that I would proceed with a salpingo-oophorectomy.  I reviewed that if extensive endometriosis is encountered such as a "socked in pelvis" that I may abort the procedure at that point and we would discuss treatment options postoperatively. She does have a Mirena IUD in place I discussed the risk of uterine manipulation during the procedure and possibly causing her to dislodge or expel the IUD.  The risks of infection, prolonged antibiotics, hemorrhage necessitating transfusion and the risks of transfusion including transfusion reaction hepatitis HIV mad cow disease and other unknown entities was reviewed. Incisional complications requiring opening and draining of incisions, closure by secondary intention and long-term issues of cosmetics/keloid formation and hernia formation were all reviewed with her. The risk of inadvertent injury to internal organs particularly with prior surgery such as bowel bladder ureters vessels and nerves either immediately recognized or delay recognized necessitating major exploratory reparative surgeries and future reparative surgeries including ostomy formation bowel resection bladder repair ureteral damage repair was all discussed understood and accepted. The patient's questions were answered to her satisfaction she is ready to proceed with surgery.    Dara Lords MD, 12:59 PM 10/25/2012

## 2012-10-30 MED ORDER — DEXTROSE 5 % IV SOLN
2.0000 g | INTRAVENOUS | Status: AC
  Administered 2012-10-31: 2 g via INTRAVENOUS
  Filled 2012-10-30: qty 2

## 2012-10-31 ENCOUNTER — Ambulatory Visit (HOSPITAL_COMMUNITY)
Admission: RE | Admit: 2012-10-31 | Discharge: 2012-10-31 | Disposition: A | Discharge status: Home / Self Care | Payer: Managed Care, Other (non HMO) | Source: Ambulatory Visit | Attending: Gynecology | Admitting: Gynecology

## 2012-10-31 ENCOUNTER — Encounter (HOSPITAL_COMMUNITY)
Admission: RE | Disposition: A | Discharge status: Home / Self Care | Payer: Self-pay | Source: Ambulatory Visit | Attending: Gynecology

## 2012-10-31 ENCOUNTER — Encounter (HOSPITAL_COMMUNITY): Payer: Self-pay | Admitting: Anesthesiology

## 2012-10-31 ENCOUNTER — Ambulatory Visit (HOSPITAL_COMMUNITY): Payer: Managed Care, Other (non HMO) | Admitting: Anesthesiology

## 2012-10-31 ENCOUNTER — Other Ambulatory Visit: Payer: Self-pay | Admitting: Gynecology

## 2012-10-31 ENCOUNTER — Encounter (HOSPITAL_COMMUNITY): Payer: Self-pay | Admitting: *Deleted

## 2012-10-31 DIAGNOSIS — N831 Corpus luteum cyst of ovary, unspecified side: Secondary | ICD-10-CM | POA: Insufficient documentation

## 2012-10-31 DIAGNOSIS — R110 Nausea: Secondary | ICD-10-CM

## 2012-10-31 DIAGNOSIS — N8000 Endometriosis of the uterus, unspecified: Secondary | ICD-10-CM

## 2012-10-31 DIAGNOSIS — T839XXA Unspecified complication of genitourinary prosthetic device, implant and graft, initial encounter: Secondary | ICD-10-CM

## 2012-10-31 DIAGNOSIS — N80109 Endometriosis of ovary, unspecified side, unspecified depth: Secondary | ICD-10-CM | POA: Insufficient documentation

## 2012-10-31 DIAGNOSIS — N83209 Unspecified ovarian cyst, unspecified side: Secondary | ICD-10-CM | POA: Insufficient documentation

## 2012-10-31 DIAGNOSIS — N801 Endometriosis of ovary: Secondary | ICD-10-CM | POA: Insufficient documentation

## 2012-10-31 DIAGNOSIS — N949 Unspecified condition associated with female genital organs and menstrual cycle: Secondary | ICD-10-CM | POA: Insufficient documentation

## 2012-10-31 DIAGNOSIS — IMO0002 Reserved for concepts with insufficient information to code with codable children: Secondary | ICD-10-CM

## 2012-10-31 DIAGNOSIS — Z01818 Encounter for other preprocedural examination: Secondary | ICD-10-CM | POA: Insufficient documentation

## 2012-10-31 DIAGNOSIS — Z01812 Encounter for preprocedural laboratory examination: Secondary | ICD-10-CM | POA: Insufficient documentation

## 2012-10-31 HISTORY — PX: LAPAROSCOPY: SHX197

## 2012-10-31 SURGERY — LAPAROSCOPY OPERATIVE
Anesthesia: General | Site: Abdomen | Laterality: Right | Wound class: Clean Contaminated

## 2012-10-31 MED ORDER — GLYCOPYRROLATE 0.2 MG/ML IJ SOLN
INTRAMUSCULAR | Status: AC
  Filled 2012-10-31: qty 1

## 2012-10-31 MED ORDER — MIDAZOLAM HCL 5 MG/5ML IJ SOLN
INTRAMUSCULAR | Status: DC | PRN
  Administered 2012-10-31: 2 mg via INTRAVENOUS

## 2012-10-31 MED ORDER — NEOSTIGMINE METHYLSULFATE 1 MG/ML IJ SOLN
INTRAMUSCULAR | Status: AC
  Filled 2012-10-31: qty 1

## 2012-10-31 MED ORDER — DEXTROSE-NACL 5-0.9 % IV SOLN: INTRAVENOUS | Status: DC

## 2012-10-31 MED ORDER — PROPOFOL 10 MG/ML IV EMUL
INTRAVENOUS | Status: AC
  Filled 2012-10-31: qty 20

## 2012-10-31 MED ORDER — HYDROCODONE-ACETAMINOPHEN 5-500 MG PO TABS: 1.0000 | ORAL_TABLET | Freq: Four times a day (QID) | ORAL | Status: DC | PRN

## 2012-10-31 MED ORDER — KETOROLAC TROMETHAMINE 30 MG/ML IJ SOLN
INTRAMUSCULAR | Status: AC
  Filled 2012-10-31: qty 1

## 2012-10-31 MED ORDER — KETOROLAC TROMETHAMINE 30 MG/ML IJ SOLN: 15.0000 mg | Freq: Once | INTRAMUSCULAR | Status: DC | PRN

## 2012-10-31 MED ORDER — PROMETHAZINE HCL 25 MG/ML IJ SOLN
INTRAMUSCULAR | Status: AC
  Administered 2012-10-31: 12.5 mg via INTRAVENOUS
  Filled 2012-10-31: qty 1

## 2012-10-31 MED ORDER — HYDROCODONE-ACETAMINOPHEN 5-325 MG PO TABS
ORAL_TABLET | ORAL | Status: AC
  Filled 2012-10-31: qty 1

## 2012-10-31 MED ORDER — FENTANYL CITRATE 0.05 MG/ML IJ SOLN
INTRAMUSCULAR | Status: DC | PRN
  Administered 2012-10-31 (×5): 50 ug via INTRAVENOUS

## 2012-10-31 MED ORDER — DEXAMETHASONE SODIUM PHOSPHATE 4 MG/ML IJ SOLN
INTRAMUSCULAR | Status: DC | PRN
  Administered 2012-10-31: 10 mg via INTRAVENOUS

## 2012-10-31 MED ORDER — FENTANYL CITRATE 0.05 MG/ML IJ SOLN
INTRAMUSCULAR | Status: AC
  Administered 2012-10-31: 25 ug via INTRAVENOUS
  Filled 2012-10-31: qty 2

## 2012-10-31 MED ORDER — FENTANYL CITRATE 0.05 MG/ML IJ SOLN
25.0000 ug | INTRAMUSCULAR | Status: DC | PRN
  Administered 2012-10-31 (×2): 25 ug via INTRAVENOUS

## 2012-10-31 MED ORDER — BUPIVACAINE HCL (PF) 0.25 % IJ SOLN
INTRAMUSCULAR | Status: DC | PRN
  Administered 2012-10-31: 5 mL

## 2012-10-31 MED ORDER — PROPOFOL 10 MG/ML IV EMUL
INTRAVENOUS | Status: DC | PRN
  Administered 2012-10-31: 150 mg via INTRAVENOUS

## 2012-10-31 MED ORDER — LACTATED RINGERS IR SOLN
Status: DC | PRN
  Administered 2012-10-31: 3000 mL

## 2012-10-31 MED ORDER — ROCURONIUM BROMIDE 50 MG/5ML IV SOLN
INTRAVENOUS | Status: AC
  Filled 2012-10-31: qty 1

## 2012-10-31 MED ORDER — PROMETHAZINE HCL 25 MG/ML IJ SOLN
12.5000 mg | Freq: Four times a day (QID) | INTRAMUSCULAR | Status: DC | PRN
  Administered 2012-10-31: 12.5 mg via INTRAVENOUS

## 2012-10-31 MED ORDER — ROCURONIUM BROMIDE 100 MG/10ML IV SOLN
INTRAVENOUS | Status: DC | PRN
  Administered 2012-10-31: 25 mg via INTRAVENOUS
  Administered 2012-10-31: 5 mg via INTRAVENOUS

## 2012-10-31 MED ORDER — PROMETHAZINE HCL 25 MG PO TABS: 25.0000 mg | ORAL_TABLET | Freq: Four times a day (QID) | ORAL | Status: DC | PRN

## 2012-10-31 MED ORDER — ONDANSETRON HCL 4 MG/2ML IJ SOLN
INTRAMUSCULAR | Status: AC
  Filled 2012-10-31: qty 2

## 2012-10-31 MED ORDER — LACTATED RINGERS IV SOLN
INTRAVENOUS | Status: DC
  Administered 2012-10-31: 14:00:00 via INTRAVENOUS
  Administered 2012-10-31: 125 mL/h via INTRAVENOUS
  Administered 2012-10-31: 13:00:00 via INTRAVENOUS
  Administered 2012-10-31: 50 mL/h via INTRAVENOUS

## 2012-10-31 MED ORDER — HYDROCODONE-ACETAMINOPHEN 5-325 MG PO TABS
1.0000 | ORAL_TABLET | Freq: Once | ORAL | Status: AC
  Administered 2012-10-31: 1 via ORAL

## 2012-10-31 MED ORDER — ONDANSETRON HCL 4 MG/2ML IJ SOLN
INTRAMUSCULAR | Status: DC | PRN
  Administered 2012-10-31: 4 mg via INTRAVENOUS

## 2012-10-31 MED ORDER — BUPIVACAINE HCL (PF) 0.25 % IJ SOLN
INTRAMUSCULAR | Status: AC
  Filled 2012-10-31: qty 30

## 2012-10-31 MED ORDER — GLYCOPYRROLATE 0.2 MG/ML IJ SOLN
INTRAMUSCULAR | Status: AC
  Filled 2012-10-31: qty 2

## 2012-10-31 MED ORDER — NEOSTIGMINE METHYLSULFATE 1 MG/ML IJ SOLN
INTRAMUSCULAR | Status: DC | PRN
  Administered 2012-10-31: 2 mg via INTRAVENOUS

## 2012-10-31 MED ORDER — MIDAZOLAM HCL 2 MG/2ML IJ SOLN
INTRAMUSCULAR | Status: AC
  Filled 2012-10-31: qty 2

## 2012-10-31 MED ORDER — KETOROLAC TROMETHAMINE 30 MG/ML IJ SOLN
INTRAMUSCULAR | Status: DC | PRN
  Administered 2012-10-31: 30 mg via INTRAMUSCULAR

## 2012-10-31 MED ORDER — DEXAMETHASONE SODIUM PHOSPHATE 10 MG/ML IJ SOLN
INTRAMUSCULAR | Status: AC
  Filled 2012-10-31: qty 1

## 2012-10-31 MED ORDER — LIDOCAINE HCL (CARDIAC) 20 MG/ML IV SOLN
INTRAVENOUS | Status: AC
  Filled 2012-10-31: qty 5

## 2012-10-31 MED ORDER — FENTANYL CITRATE 0.05 MG/ML IJ SOLN
INTRAMUSCULAR | Status: AC
  Filled 2012-10-31: qty 5

## 2012-10-31 MED ORDER — GLYCOPYRROLATE 0.2 MG/ML IJ SOLN
INTRAMUSCULAR | Status: DC | PRN
  Administered 2012-10-31: 0.4 mg via INTRAVENOUS
  Administered 2012-10-31: 0.2 mg via INTRAVENOUS

## 2012-10-31 MED ORDER — LIDOCAINE HCL (CARDIAC) 20 MG/ML IV SOLN
INTRAVENOUS | Status: DC | PRN
  Administered 2012-10-31: 70 mg via INTRAVENOUS

## 2012-10-31 SURGICAL SUPPLY — 22 items
BLADE SURG 15 STRL LF C SS BP (BLADE) ×1 IMPLANT
BLADE SURG 15 STRL SS (BLADE) ×1
CABLE HIGH FREQUENCY MONO STRZ (ELECTRODE) IMPLANT
CATH ROBINSON RED A/P 16FR (CATHETERS) ×2 IMPLANT
CLOTH BEACON ORANGE TIMEOUT ST (SAFETY) ×2 IMPLANT
DERMABOND ADVANCED (GAUZE/BANDAGES/DRESSINGS) ×1
DERMABOND ADVANCED .7 DNX12 (GAUZE/BANDAGES/DRESSINGS) ×1 IMPLANT
GLOVE BIO SURGEON STRL SZ7.5 (GLOVE) ×4 IMPLANT
GOWN PREVENTION PLUS LG XLONG (DISPOSABLE) ×2 IMPLANT
GOWN STRL REIN XL XLG (GOWN DISPOSABLE) ×4 IMPLANT
NS IRRIG 1000ML POUR BTL (IV SOLUTION) ×2 IMPLANT
PACK LAPAROSCOPY BASIN (CUSTOM PROCEDURE TRAY) ×2 IMPLANT
POUCH SPECIMEN RETRIEVAL 10MM (ENDOMECHANICALS) ×2 IMPLANT
PROTECTOR NERVE ULNAR (MISCELLANEOUS) ×4 IMPLANT
SCALPEL HARMONIC ACE (MISCELLANEOUS) ×2 IMPLANT
SET IRRIG TUBING LAPAROSCOPIC (IRRIGATION / IRRIGATOR) ×2 IMPLANT
SUT PLAIN 4 0 FS 2 27 (SUTURE) ×2 IMPLANT
SUT VICRYL 0 UR6 27IN ABS (SUTURE) ×2 IMPLANT
TOWEL OR 17X24 6PK STRL BLUE (TOWEL DISPOSABLE) ×4 IMPLANT
TROCAR XCEL NON-BLD 11X100MML (ENDOMECHANICALS) ×4 IMPLANT
TROCAR XCEL NON-BLD 5MMX100MML (ENDOMECHANICALS) ×2 IMPLANT
WATER STERILE IRR 1000ML POUR (IV SOLUTION) ×2 IMPLANT

## 2012-10-31 NOTE — H&P (Signed)
  The patient was examined.  I reviewed the proposed surgery and consent form with the patient.  The dictated history and physical is current and accurate and all questions were answered. The patient is ready to proceed with surgery and has a realistic understanding and expectation for the outcome.   Dara Lords MD, 12:52 PM 10/31/2012

## 2012-10-31 NOTE — Op Note (Signed)
Christina Ramsey [birth year] [id number]   Post Operative Note   Date of surgery:  10/31/2012  Pre Op Dx:  Dyspareunia, pelvic pain, persistent right ovarian cyst  Post Op Dx:  Dyspareunia, pelvic pain, endometriosis, right uterine cornual varicosities  Procedure:  Laparoscopic right ovarian cystectomy, fulguration endometriosis  Surgeon:  Dara Lords  Assistant:  Reynaldo Minium  Anesthesia:  General  EBL:  minimal  Complications:  None  Specimen:  #1 opening cell washings #2 ovarian biopsy with endometriosis to pathology  Findings: EUA:  External BUS vagina normal. Cervix normal. Uterus normal size midline mobile. Adnexa without gross masses   Operative:  Anterior cul-de-sac normal with minimal vesico-uterine peritoneal scarring. Posterior cul-de-sac normal. Uterus grossly normal in size and shape. Grossly dilated vascularity in the right cornual region at the insertion of the fallopian tube extending inferiorly along the lateral border of the uterus. Right and left fallopian tubes normal length caliber fimbriated ends. Left ovary grossly normal free and mobile. Right ovary with small cyst palpable through the ovarian capsule with several shaggy brown classic endometriotic implants on the surface of the ovary. Incisions into the cyst was consistent with a classic corpus luteum.  Upper abdominal exam is grossly normal noting the appendix free and mobile, liver smooth with no abnormalities and no perihepatic adhesions. Gallbladder not visualized  Procedure:  Patient was taken to the operating room, underwent general anesthesia, placed in the low dorsal lithotomy position, received abdominal/perineal/vaginal preparation with Betadine solution, bladder emptied with an in and out Foley catheterization, EUA performed and a sponge stick was placed into the vagina for manipulation due to the presence of her IUD.  A timeout was performed by the surgical team. The patient was draped in the  usual fashion and a repeat transverse infraumbilical incision was made. The abdomen was directly entered using the 10 mm Optiview trocar under direct visualization without difficulty and subsequently insufflated. Right and left 5 mm suprapubic ports were then placed under direct visualization after transillumination for the vessels without difficulty.  Examination of the pelvic organs and upper abdominal exam was carried out with findings noted above.  Opening cell washings were taken and sent to pathology.  The right ovary was stabilized and the right cyst was entered using the harmonic scalpel with the internal findings consistent with a corpus luteum. A representative overlying area of the capsule with associated endometriosis was excised and sent to pathology. Bipolar cautery was then delivered to the edges of the incision line for hemostasis, within the cyst for obliteration of the cyst wall and to the few remaining areas of endometriosis on the ovarian capsule.  The pelvis was copiously irrigated showing adequate hemostasis and no other evidence of endometriosis. Attention was then turned to the vascularity in the right uterine cornual region. The areas were inspected and found to be consistent with venous vascular changes as they disappeared with pressure and then reinsufflated with removal of the pressure. For fear of hemorrhage necessitating more extensive surgery to include possible hysterectomy, these areas were left undisturbed.  The 5 mm ports were then removed under direct visualization showing adequate hemostasis the gas slowly allowed to escape again showing hemostasis at the right ovary and the infraumbilical port was then backed out under visualization showing adequate hemostasis and no evidence of hernia formation. All skin incisions were injected using 0.25% Marcaine. The infraumbilical port was closed using 0 Vicryl suture in an interrupted subcutaneous fascial stitch. All skin incisions were  closed using Dermabond skin adhesive.  The sponge stick was removed from the vagina, the patient placed in the supine position and awakened without difficulty having tolerated the procedure well.    Dara Lords MD, 2:33 PM 10/31/2012

## 2012-10-31 NOTE — Preoperative (Signed)
Beta Blockers   Reason not to administer Beta Blockers:Not Applicable 

## 2012-10-31 NOTE — Anesthesia Preprocedure Evaluation (Signed)
Anesthesia Evaluation  Patient identified by MRN, date of birth, ID band Patient awake    Reviewed: Allergy & Precautions, H&P , NPO status , Patient's Chart, lab work & pertinent test results, reviewed documented beta blocker date and time   History of Anesthesia Complications Negative for: history of anesthetic complications  Airway Mallampati: I TM Distance: >3 FB Neck ROM: full    Dental  (+) Teeth Intact   Pulmonary neg pulmonary ROS,  breath sounds clear to auscultation  Pulmonary exam normal       Cardiovascular Exercise Tolerance: Good negative cardio ROS  Rhythm:regular Rate:Normal     Neuro/Psych  Headaches (migraines - once a week), negative psych ROS   GI/Hepatic negative GI ROS, Neg liver ROS,   Endo/Other  negative endocrine ROS  Renal/GU negative Renal ROS  Female GU complaint (ovarian cyst)     Musculoskeletal   Abdominal   Peds  Hematology negative hematology ROS (+)   Anesthesia Other Findings   Reproductive/Obstetrics negative OB ROS                           Anesthesia Physical Anesthesia Plan  ASA: I  Anesthesia Plan: General ETT   Post-op Pain Management:    Induction:   Airway Management Planned:   Additional Equipment:   Intra-op Plan:   Post-operative Plan:   Informed Consent: I have reviewed the patients History and Physical, chart, labs and discussed the procedure including the risks, benefits and alternatives for the proposed anesthesia with the patient or authorized representative who has indicated his/her understanding and acceptance.   Dental Advisory Given  Plan Discussed with: CRNA and Surgeon  Anesthesia Plan Comments:         Anesthesia Quick Evaluation

## 2012-10-31 NOTE — Anesthesia Postprocedure Evaluation (Signed)
Anesthesia Post Note  Patient: Christina Ramsey  Procedure(s) Performed: Procedure(s) (LRB): LAPAROSCOPY OPERATIVE (Right)  Anesthesia type: GA  Patient location: PACU  Post pain: Pain level controlled  Post assessment: Post-op Vital signs reviewed  Last Vitals:  Filed Vitals:   10/31/12 1430  BP: 99/60  Pulse: 67  Temp:   Resp: 16    Post vital signs: Reviewed  Level of consciousness: sedated  Complications: No apparent anesthesia complications

## 2012-10-31 NOTE — Transfer of Care (Signed)
Immediate Anesthesia Transfer of Care Note  Patient: Christina Ramsey  Procedure(s) Performed: Procedure(s) (LRB) with comments: LAPAROSCOPY OPERATIVE (Right)  Patient Location: PACU  Anesthesia Type:General  Level of Consciousness: awake, oriented and patient cooperative  Airway & Oxygen Therapy: Patient Spontanous Breathing and Patient connected to nasal cannula oxygen  Post-op Assessment: Report given to PACU RN and Post -op Vital signs reviewed and stable  Post vital signs: Reviewed and stable  Complications: No apparent anesthesia complications

## 2012-11-01 ENCOUNTER — Encounter (HOSPITAL_COMMUNITY): Payer: Self-pay | Admitting: Gynecology

## 2012-11-14 ENCOUNTER — Ambulatory Visit (INDEPENDENT_AMBULATORY_CARE_PROVIDER_SITE_OTHER): Payer: Managed Care, Other (non HMO) | Admitting: Gynecology

## 2012-11-14 ENCOUNTER — Encounter: Payer: Self-pay | Admitting: Gynecology

## 2012-11-14 DIAGNOSIS — Z9889 Other specified postprocedural states: Secondary | ICD-10-CM

## 2012-11-14 DIAGNOSIS — N809 Endometriosis, unspecified: Secondary | ICD-10-CM

## 2012-11-14 NOTE — Progress Notes (Signed)
Postoperative visit status post right ovarian cystectomy. Findings at the time of surgery were reviewed to include endometriosis right ovary and large varicosities right cornual region of the uterus. Patient's done well postoperatively without complaints.  Exam with Fleet Contras Asst. Abdomen soft nontender without masses guarding rebound organomegaly. Incisions healed nicely. Pelvic external BUS vagina normal. Bimanual without masses or tenderness. Uterus normal size midline mobile.  Assessment and plan: Postoperative visit status post laparoscopic right ovarian cystectomy with findings of endometriosis. Also large there posses right cornual region. A discussed unknown significance of these. Possible related to endometriosis within the uterine wall or possible vascular hemangioma. HCG negative. Plan expected management. If pain or bleeding issues continue consider hysterectomy. Patient will follow up in August 2014 for her annual exam, sooner if any issues.

## 2012-11-14 NOTE — Patient Instructions (Signed)
Keep menstrual/pain calendar. Follow up in August for annual exam sooner if pain or bleeding abnormalities.

## 2013-07-11 ENCOUNTER — Encounter: Payer: Self-pay | Admitting: Gynecology

## 2013-07-11 ENCOUNTER — Ambulatory Visit (INDEPENDENT_AMBULATORY_CARE_PROVIDER_SITE_OTHER): Payer: Managed Care, Other (non HMO) | Admitting: Gynecology

## 2013-07-11 VITALS — BP 110/60 | Ht 61.0 in | Wt 146.0 lb

## 2013-07-11 DIAGNOSIS — N926 Irregular menstruation, unspecified: Secondary | ICD-10-CM

## 2013-07-11 DIAGNOSIS — IMO0002 Reserved for concepts with insufficient information to code with codable children: Secondary | ICD-10-CM

## 2013-07-11 DIAGNOSIS — Z975 Presence of (intrauterine) contraceptive device: Secondary | ICD-10-CM

## 2013-07-11 DIAGNOSIS — Z13220 Encounter for screening for lipoid disorders: Secondary | ICD-10-CM

## 2013-07-11 DIAGNOSIS — Z01419 Encounter for gynecological examination (general) (routine) without abnormal findings: Secondary | ICD-10-CM

## 2013-07-11 NOTE — Patient Instructions (Signed)
Follow up for ultrasound as scheduled 

## 2013-07-11 NOTE — Progress Notes (Signed)
Christina Ramsey July 04, 1971 [id number]        42 y.o.  [id number] for annual exam.  Several issues noted below.  Past medical history,surgical history, medications, allergies, family history and social history were all reviewed and documented in the EPIC chart.  ROS:  Performed and pertinent positives and negatives are included in the history, assessment and plan .  Exam: Christina Ramsey assistant Filed Vitals:   07/11/13 1506  BP: 110/60  Height: 5\' 1"  (1.549 m)  Weight: 146 lb (66.225 kg)   General appearance  Normal Skin grossly normal Head/Neck normal with no cervical or supraclavicular adenopathy thyroid normal Lungs  clear Cardiac RR, without RMG Abdominal  soft, nontender, without masses, organomegaly or hernia. Well-healed abdominoplasty scars Breasts  examined lying and sitting without masses, retractions, discharge or axillary adenopathy. Bilateral implants noted Pelvic  Ext/BUS/vagina  normal  Cervix  normal IUD string visualized  Uterus  retroverted, normal size, shape and contour, midline and mobile nontender   Adnexa  Without masses or tenderness    Anus and perineum  normal   Rectovaginal  normal sphincter tone without palpated masses or tenderness.    Assessment/Plan:  42 y.o. [id number] female for annual exam.  1. Breakthrough bleeding over the last 2 months. Mirena IUD in place. Also developing deep dyspareunia consistently over the last 2 months. History of laparoscopic proven endometriosis. Start with ultrasound rule out endometriomas/uterine pathology. Discussed various possibilities to include re\re laparoscopy up to and including hysterectomy. Will rediscuss at ultrasound. 2. Pap smear/HPV negative 2013. No Pap smear done today. No history of significant abnormal Pap smears. Plan repeat at 3-5 year interval. 3. Mammography 2013. Recommend repeat mammography this coming fall and annually. SBE monthly reviewed. 4. Health maintenance. Baseline CBC comprehensive metabolic panel  lipid profile urinalysis ordered. Followup for ultrasound as scheduled.  Note: This document was prepared with digital dictation and possible smart phrase technology. Any transcriptional errors that result from this process are unintentional.   Christina Lords MD, 3:40 PM 07/11/2013

## 2013-07-12 LAB — CBC WITH DIFFERENTIAL/PLATELET
Basophils Relative: 1 % (ref 0–1)
Eosinophils Relative: 4 % (ref 0–5)
HCT: 42 % (ref 36.0–46.0)
Hemoglobin: 14.4 g/dL (ref 12.0–15.0)
MCH: 29.7 pg (ref 26.0–34.0)
MCHC: 34.3 g/dL (ref 30.0–36.0)
MCV: 86.6 fL (ref 78.0–100.0)
Monocytes Absolute: 0.6 10*3/uL (ref 0.1–1.0)
Monocytes Relative: 8 % (ref 3–12)
Neutro Abs: 3.2 10*3/uL (ref 1.7–7.7)

## 2013-07-12 LAB — URINALYSIS W MICROSCOPIC + REFLEX CULTURE
Bacteria, UA: NONE SEEN
Casts: NONE SEEN
Glucose, UA: NEGATIVE mg/dL
Hgb urine dipstick: NEGATIVE
Ketones, ur: NEGATIVE mg/dL
pH: 6 (ref 5.0–8.0)

## 2013-07-12 LAB — LIPID PANEL
HDL: 80 mg/dL (ref >39)
Total CHOL/HDL Ratio: 2.3 Ratio
Triglycerides: 68 mg/dL (ref <150)

## 2013-07-12 LAB — COMPREHENSIVE METABOLIC PANEL
Albumin: 4 g/dL (ref 3.5–5.2)
Alkaline Phosphatase: 63 U/L (ref 39–117)
BUN: 9 mg/dL (ref 6–23)
Calcium: 8.8 mg/dL (ref 8.4–10.5)
Creat: 0.66 mg/dL (ref 0.50–1.10)
Glucose, Bld: 66 mg/dL — ABNORMAL LOW (ref 70–99)
Potassium: 3.8 mEq/L (ref 3.5–5.3)

## 2013-07-23 ENCOUNTER — Ambulatory Visit (INDEPENDENT_AMBULATORY_CARE_PROVIDER_SITE_OTHER): Payer: Managed Care, Other (non HMO)

## 2013-07-23 ENCOUNTER — Ambulatory Visit (INDEPENDENT_AMBULATORY_CARE_PROVIDER_SITE_OTHER): Payer: Managed Care, Other (non HMO) | Admitting: Gynecology

## 2013-07-23 ENCOUNTER — Encounter: Payer: Self-pay | Admitting: Gynecology

## 2013-07-23 DIAGNOSIS — N949 Unspecified condition associated with female genital organs and menstrual cycle: Secondary | ICD-10-CM

## 2013-07-23 DIAGNOSIS — R102 Pelvic and perineal pain: Secondary | ICD-10-CM

## 2013-07-23 DIAGNOSIS — N83209 Unspecified ovarian cyst, unspecified side: Secondary | ICD-10-CM

## 2013-07-23 DIAGNOSIS — IMO0002 Reserved for concepts with insufficient information to code with codable children: Secondary | ICD-10-CM

## 2013-07-23 DIAGNOSIS — Z975 Presence of (intrauterine) contraceptive device: Secondary | ICD-10-CM

## 2013-07-23 DIAGNOSIS — N888 Other specified noninflammatory disorders of cervix uteri: Secondary | ICD-10-CM

## 2013-07-23 DIAGNOSIS — N926 Irregular menstruation, unspecified: Secondary | ICD-10-CM

## 2013-07-23 DIAGNOSIS — N72 Inflammatory disease of cervix uteri: Secondary | ICD-10-CM

## 2013-07-23 DIAGNOSIS — N809 Endometriosis, unspecified: Secondary | ICD-10-CM

## 2013-07-23 DIAGNOSIS — N921 Excessive and frequent menstruation with irregular cycle: Secondary | ICD-10-CM

## 2013-07-23 NOTE — Progress Notes (Signed)
Patient presents for ultrasound with history of deep dyspareunia with every coital episode and irregular bleeding. She notes pain with intercourse before her last laparoscopy which resolved after treatment of her endometriosis but has returned of the last several months. She also has bleeding after intercourse the last up to a week but also spontaneous bleeding between her light menses. She's had the Mirena IUD since 2012 and note prior to this she was bleeding up to 3 weeks at a time. Findings at the time of laparoscopy 10/2012 included endometriosis of the right ovary and tortuous varicosities in the right cornual region.  Ultrasound shows uterus normal size with endometrial echo 3.7 mm. IUD identified in the normal position. Right ovary grossly normal. Left ovary with echo-free avascular cyst 24 mm mean. Prominent bilateral vascularity in the pelvis noted. Cul-de-sac negative.  Assessment and plan: Irregular bleeding, persistent deep dyspareunia, laparoscopic proven endometriosis with Mirena IUD in place. Small probable functional left ovarian cyst on ultrasound with prominent pelvic vascularity. Options for management including expectant with repeat ultrasound in 2 months, more aggressive menstrual suppression such as Depo-Lupron, repeat laparoscopy with conservative treatment of endometriosis if encountered and hysterectomy with ovarian conservation recognizing the possibility of reoperation in the future. The pros/cons of each choice were reviewed with the patient in the cannot guarantee persistence of her pelvic pain discussed and stressed. Patient will think of her options and followup with me for further discussion. I did review with the uterine varicosities whether adenomyosis is contributed to her situation also.

## 2013-07-23 NOTE — Patient Instructions (Signed)
Followup with your decision about which course of action in reference to your pelvic pain and bleeding.

## 2013-09-06 IMAGING — US US ABDOMEN COMPLETE
1 series · 14 of 25 positions shown · non-contrast
Comparison: None.

CLINICAL DATA: Right upper quadrant pain, elevated LFTs

ABDOMINAL ULTRASOUND COMPLETE

[Series 1: us abdomen complete · 0.17mm/px · 14 of 64 slices shown]
[im 1/64]
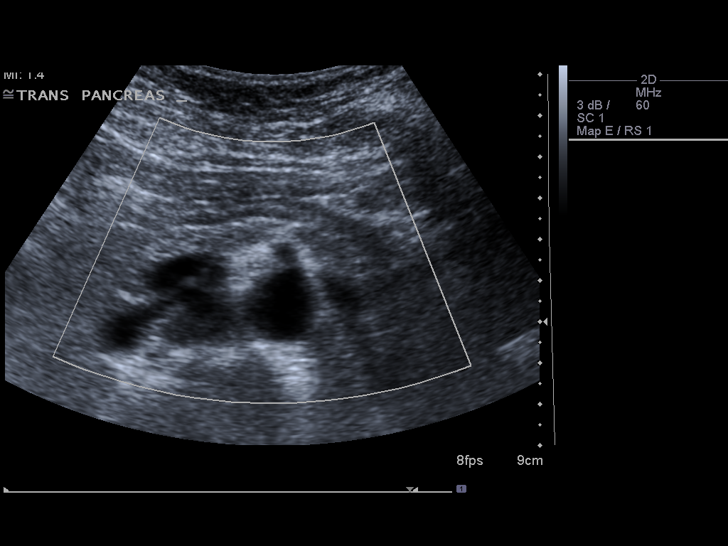
[im 6/64]
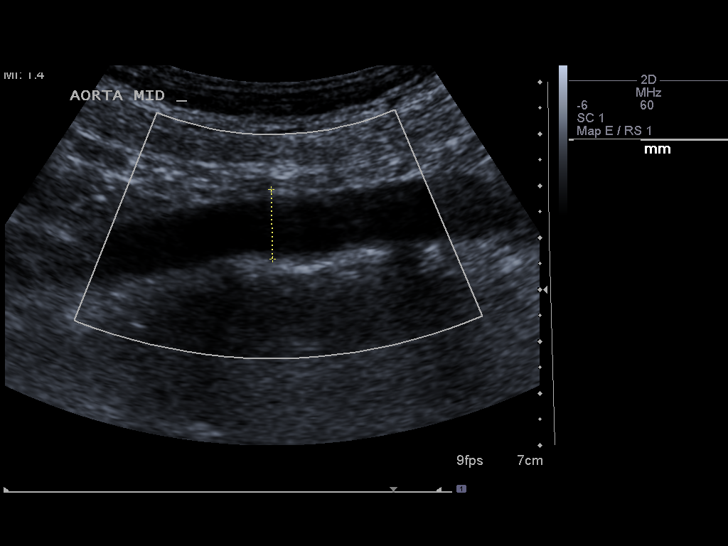
[im 11/64]
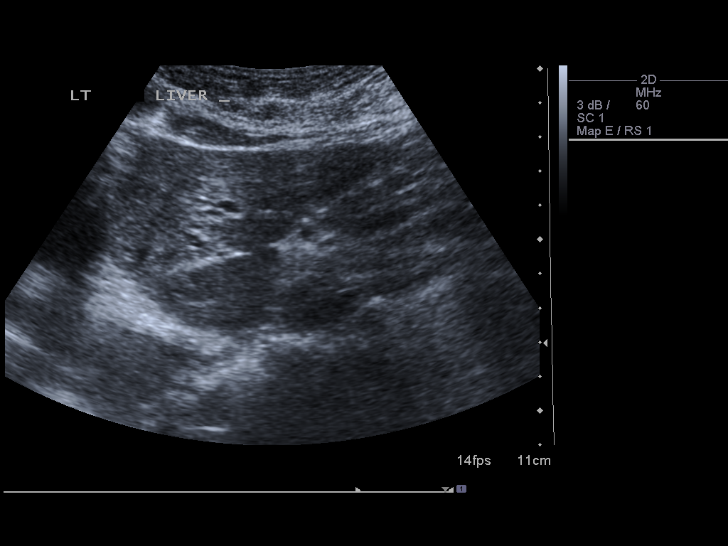
[im 16/64]
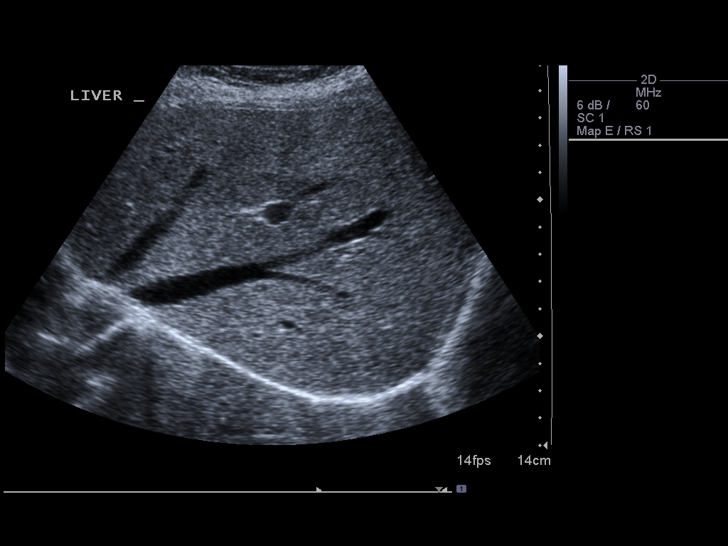
[im 22/64]
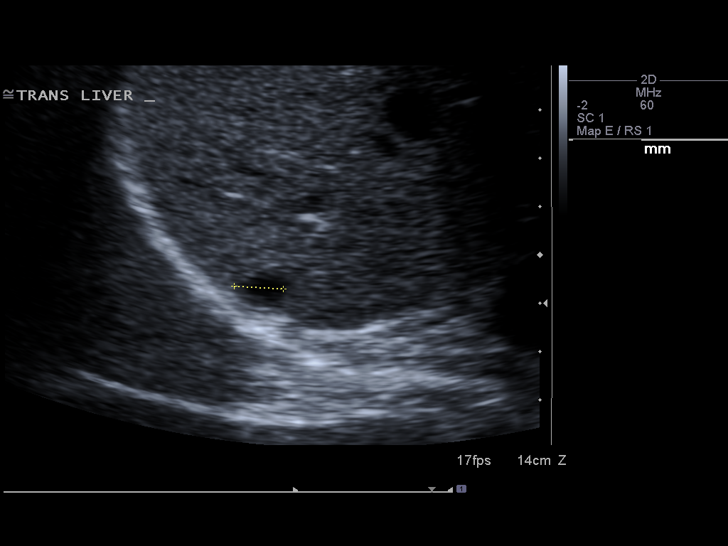
[im 24/64]
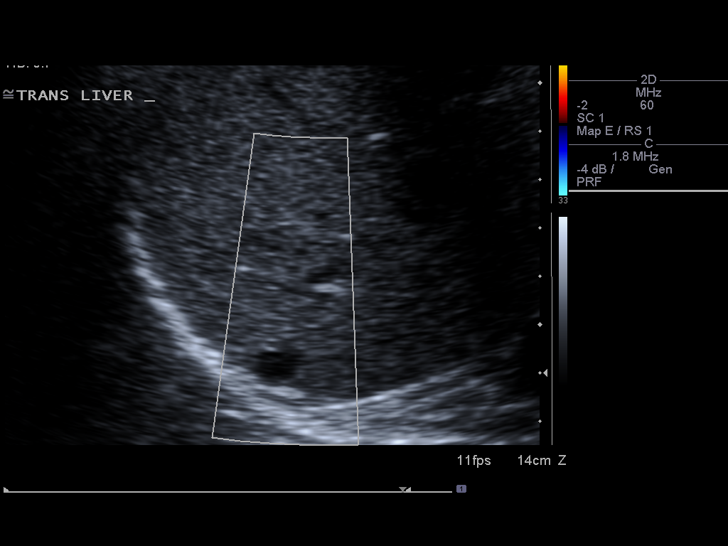
[im 29/64]
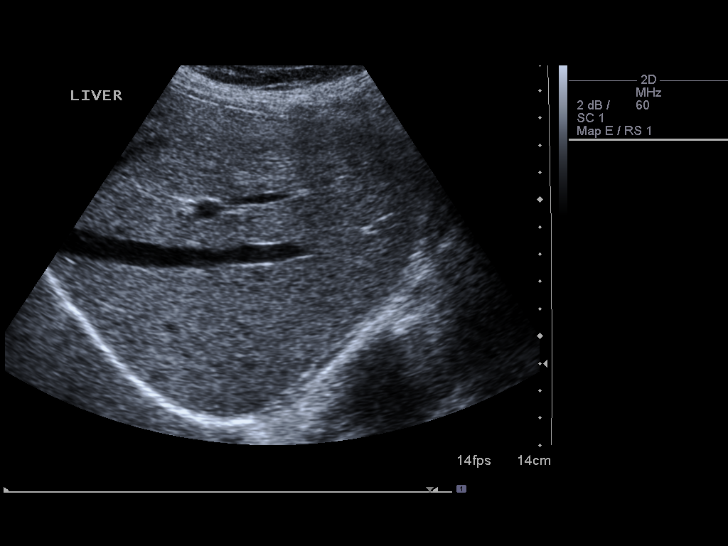
[im 35/64]
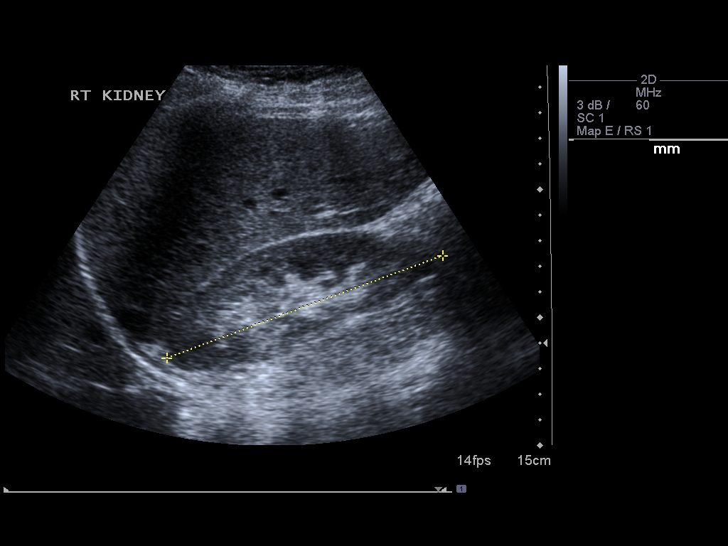
[im 40/64]
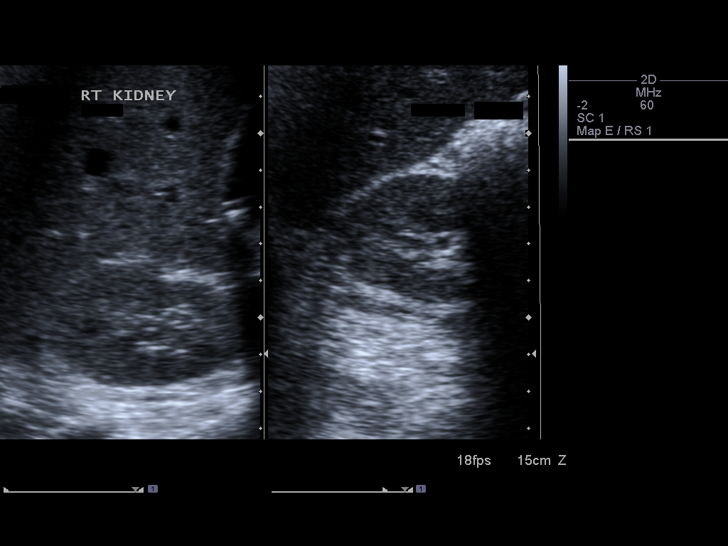
[im 43/64]
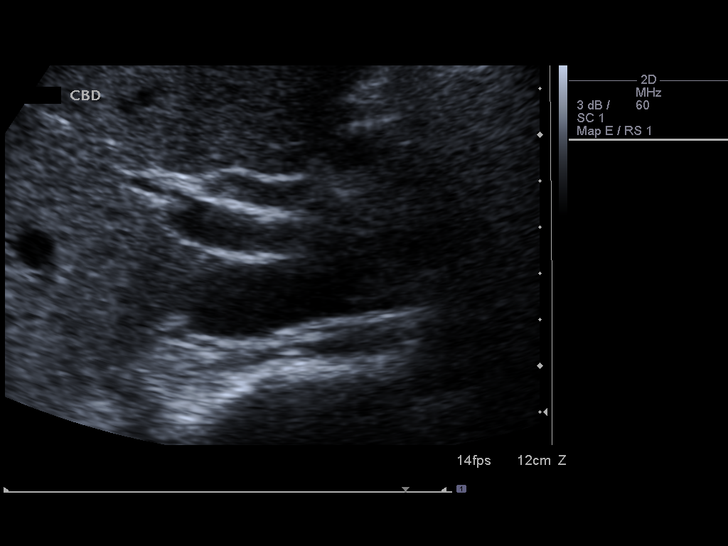
[im 48/64]
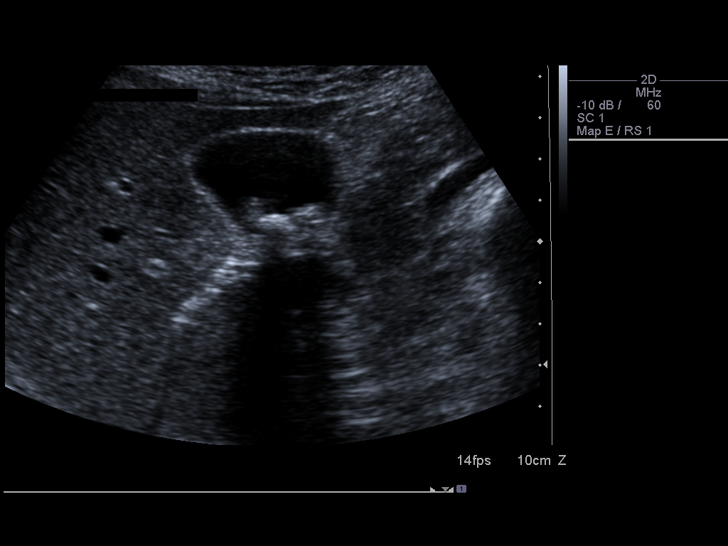
[im 53/64]
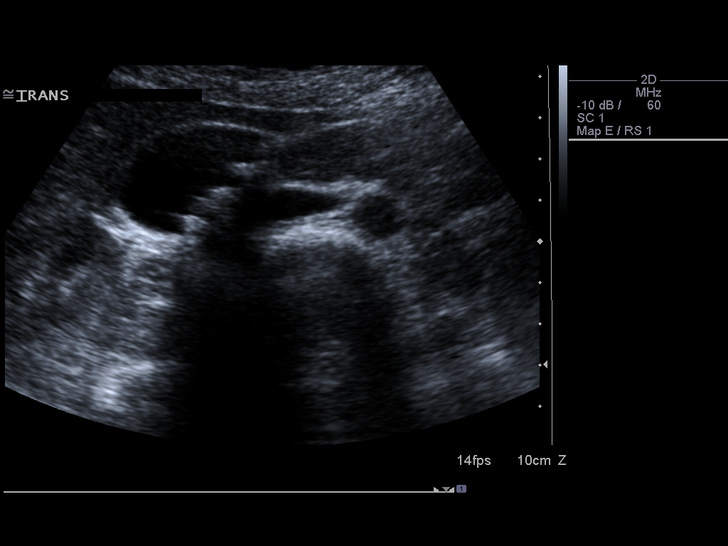
[im 58/64]
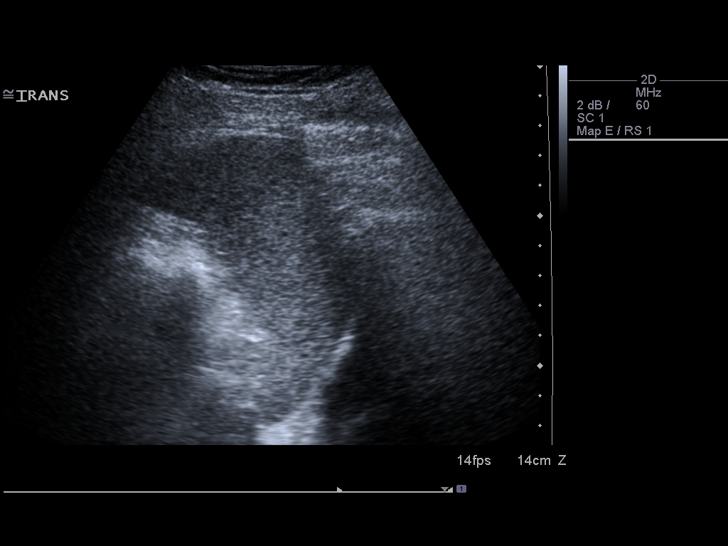
[im 64/64]
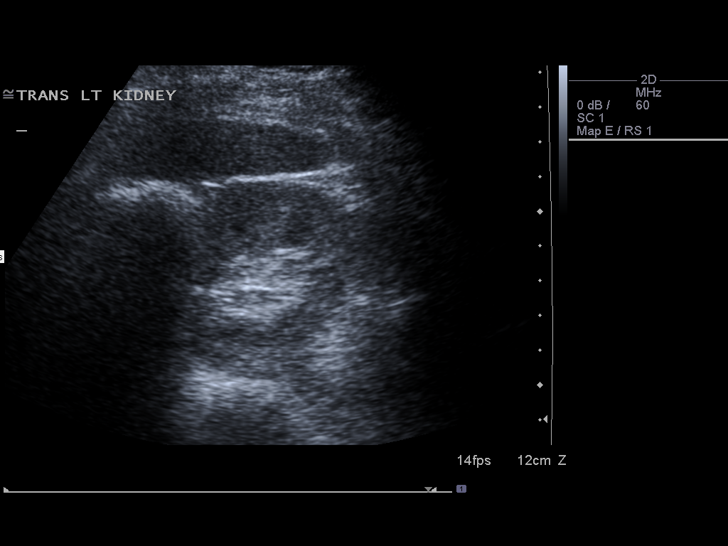

[14 of 25 positions shown; findings below may reference images not displayed]

FINDINGS: Gallbladder:  Numerous echogenic shadowing layering gallstones.
Despite this, there is no elicited Murphy's sign, wall thickening
or pericholecystic fluid.  No signs of cholecystitis.  Wall
thickness measures 1.9 mm.

Common Bile Duct:  Within normal limits in caliber.

Liver: Normal echogenicity.  Incidental right posterior hepatic
lobe 11 mm cyst beneath the diaphragm identified.  No biliary
dilatation.

IVC:  Appears normal.

Pancreas:  No abnormality identified.

Spleen:  Within normal limits in size and echotexture.

Right kidney:  Normal in size and parenchymal echogenicity.  No
evidence of mass or hydronephrosis.

Left kidney:  Normal in size and parenchymal echogenicity.  No
evidence of mass or hydronephrosis.

Abdominal Aorta:  No aneurysm identified.
IMPRESSION: Cholelithiasis.  No ultrasound evidence of acute cholecystitis.

Incidental 11 mm hepatic cyst.

## 2013-09-22 ENCOUNTER — Other Ambulatory Visit: Payer: Self-pay | Admitting: Foot & Ankle Surgery

## 2013-09-22 DIAGNOSIS — M25571 Pain in right ankle and joints of right foot: Secondary | ICD-10-CM

## 2013-09-25 ENCOUNTER — Ambulatory Visit
Admission: RE | Admit: 2013-09-25 | Discharge: 2013-09-25 | Disposition: A | Discharge status: Home / Self Care | Payer: Managed Care, Other (non HMO) | Source: Ambulatory Visit | Attending: Foot & Ankle Surgery | Admitting: Foot & Ankle Surgery

## 2013-09-25 DIAGNOSIS — M25571 Pain in right ankle and joints of right foot: Secondary | ICD-10-CM

## 2014-07-20 ENCOUNTER — Ambulatory Visit (INDEPENDENT_AMBULATORY_CARE_PROVIDER_SITE_OTHER): Payer: Managed Care, Other (non HMO) | Admitting: Gynecology

## 2014-07-20 ENCOUNTER — Encounter: Payer: Self-pay | Admitting: Gynecology

## 2014-07-20 VITALS — BP 118/76 | Ht 60.75 in | Wt 150.0 lb

## 2014-07-20 DIAGNOSIS — Z01419 Encounter for gynecological examination (general) (routine) without abnormal findings: Secondary | ICD-10-CM

## 2014-07-20 DIAGNOSIS — IMO0002 Reserved for concepts with insufficient information to code with codable children: Secondary | ICD-10-CM

## 2014-07-20 DIAGNOSIS — N930 Postcoital and contact bleeding: Secondary | ICD-10-CM

## 2014-07-20 LAB — COMPREHENSIVE METABOLIC PANEL
ALBUMIN: 4.2 g/dL (ref 3.5–5.2)
ALK PHOS: 70 U/L (ref 39–117)
ALT: 20 U/L (ref 0–35)
AST: 25 U/L (ref 0–37)
BILIRUBIN TOTAL: 0.6 mg/dL (ref 0.2–1.2)
BUN: 11 mg/dL (ref 6–23)
CO2: 27 meq/L (ref 19–32)
Calcium: 8.7 mg/dL (ref 8.4–10.5)
Chloride: 103 mEq/L (ref 96–112)
Creat: 0.74 mg/dL (ref 0.50–1.10)
GLUCOSE: 76 mg/dL (ref 70–99)
Potassium: 4.1 mEq/L (ref 3.5–5.3)
SODIUM: 138 meq/L (ref 135–145)
TOTAL PROTEIN: 6.6 g/dL (ref 6.0–8.3)

## 2014-07-20 LAB — CBC WITH DIFFERENTIAL/PLATELET
BASOS PCT: 1 % (ref 0–1)
Basophils Absolute: 0.1 10*3/uL (ref 0.0–0.1)
EOS ABS: 0.2 10*3/uL (ref 0.0–0.7)
Eosinophils Relative: 4 % (ref 0–5)
HEMATOCRIT: 41.8 % (ref 36.0–46.0)
HEMOGLOBIN: 14.5 g/dL (ref 12.0–15.0)
LYMPHS ABS: 1.9 10*3/uL (ref 0.7–4.0)
Lymphocytes Relative: 36 % (ref 12–46)
MCH: 30.1 pg (ref 26.0–34.0)
MCHC: 34.7 g/dL (ref 30.0–36.0)
MCV: 86.7 fL (ref 78.0–100.0)
MONO ABS: 0.5 10*3/uL (ref 0.1–1.0)
MONOS PCT: 9 % (ref 3–12)
Neutro Abs: 2.7 10*3/uL (ref 1.7–7.7)
Neutrophils Relative %: 50 % (ref 43–77)
Platelets: 318 10*3/uL (ref 150–400)
RBC: 4.82 MIL/uL (ref 3.87–5.11)
RDW: 13.8 % (ref 11.5–15.5)
WBC: 5.3 10*3/uL (ref 4.0–10.5)

## 2014-07-20 LAB — LIPID PANEL
CHOLESTEROL: 180 mg/dL (ref 0–200)
HDL: 68 mg/dL (ref >39)
LDL CALC: 96 mg/dL (ref 0–99)
TRIGLYCERIDES: 78 mg/dL (ref <150)
Total CHOL/HDL Ratio: 2.6 Ratio
VLDL: 16 mg/dL (ref 0–40)

## 2014-07-20 LAB — TSH: TSH: 1.733 u[IU]/mL (ref 0.350–4.500)

## 2014-07-20 NOTE — Progress Notes (Signed)
Christina Ramsey 06/30/1971 [id number]        43 y.o.  [id number] for annual exam.  Several issues noted below.  Past medical history,surgical history, problem list, medications, allergies, family history and social history were all reviewed and documented as reviewed in the EPIC chart.  ROS:  12 system ROS performed with pertinent positives and negatives included in the history, assessment and plan.   Additional significant findings :  None   Exam: Programmer, multimedia Vitals:   07/20/14 1003  BP: 118/76  Height: 5' 0.75" (1.543 m)  Weight: 150 lb (68.04 kg)   General appearance:  Normal affect, orientation and appearance. Skin: Grossly normal HEENT: Without gross lesions.  No cervical or supraclavicular adenopathy. Thyroid normal.  Lungs:  Clear without wheezing, rales or rhonchi Cardiac: RR, without RMG Abdominal:  Soft, nontender, without masses, guarding, rebound, organomegaly or hernia Breasts:  Examined lying and sitting without masses, retractions, discharge or axillary adenopathy. Bilateral implants noted Pelvic:  Ext/BUS/vagina normal  Cervix normal with IUD string visualized.  Uterus retroverted, somewhat boggy, normal size, shape and contour, midline and mobile nontender   Adnexa  Without masses or tenderness    Anus and perineum  Normal   Rectovaginal  Normal sphincter tone without palpated masses or tenderness.    Assessment/Plan:  43 y.o. [id number] female for annual exam with light regular menses, Mirena IUD.   1. Mirena IUD 03/2011. Having light regular menses. IUD strings visualized. Continue to monitor at present. 2. Post coital bleeding/deep dyspareunia. Patient continues to have pain with intercourse since that somethings being hit as well as bleeding afterwards. Similar complaints last year. Has not improved. Also had ovarian cyst that she was to followup for repeat ultrasound in 2 months that she did not do. Schedule baseline ultrasound now. Does have laparoscopic  proven endometriosis 2013. Exam is somewhat suggestive of adenomyosis. Uterus is retroverted which could account for the deep dyspareunia. Options to include more aggressive hormonal suppression such as Depo-Lupron, repeat laparoscopy, hysterectomy. The pros and cons as well as general risks/benefits of each choice reviewed. Patient's going to think of her options in followup for the ultrasound. Will also check baseline hormone levels of TSH prolactin FSH. 3. Pap smear/HPV negative 2013. No Pap smear done today. No history of significant abnormal Pap smears previously. Will repeat at 3-5 year interval per current screening guidelines. 4. Mammography 2013. Recommended baseline mammogram now and patient agrees to schedule. SBE monthly reviewed. 5. Health maintenance. Baseline CBC comprehensive metabolic panel lipid profile urinalysis ordered. Followup for ultrasound as scheduled.   Note: This document was prepared with digital dictation and possible smart phrase technology. Any transcriptional errors that result from this process are unintentional.   Anastasio Auerbach MD, 10:24 AM 07/20/2014

## 2014-07-20 NOTE — Patient Instructions (Signed)
Follow up for ultrasound as scheduled.  You may obtain a copy of any labs that were done today by logging onto MyChart as outlined in the instructions provided with your AVS (after visit summary). The office will not call with normal lab results but certainly if there are any significant abnormalities then we will contact you.   Health Maintenance, Female A healthy lifestyle and preventative care can promote health and wellness.  Maintain regular health, dental, and eye exams.  Eat a healthy diet. Foods like vegetables, fruits, whole grains, low-fat dairy products, and lean protein foods contain the nutrients you need without too many calories. Decrease your intake of foods high in solid fats, added sugars, and salt. Get information about a proper diet from your caregiver, if necessary.  Regular physical exercise is one of the most important things you can do for your health. Most adults should get at least 150 minutes of moderate-intensity exercise (any activity that increases your heart rate and causes you to sweat) each week. In addition, most adults need muscle-strengthening exercises on 2 or more days a week.   Maintain a healthy weight. The body mass index (BMI) is a screening tool to identify possible weight problems. It provides an estimate of body fat based on height and weight. Your caregiver can help determine your BMI, and can help you achieve or maintain a healthy weight. For adults 20 years and older:  A BMI below 18.5 is considered underweight.  A BMI of 18.5 to 24.9 is normal.  A BMI of 25 to 29.9 is considered overweight.  A BMI of 30 and above is considered obese.  Maintain normal blood lipids and cholesterol by exercising and minimizing your intake of saturated fat. Eat a balanced diet with plenty of fruits and vegetables. Blood tests for lipids and cholesterol should begin at age 20 and be repeated every 5 years. If your lipid or cholesterol levels are high, you are over  50, or you are a high risk for heart disease, you may need your cholesterol levels checked more frequently.Ongoing high lipid and cholesterol levels should be treated with medicines if diet and exercise are not effective.  If you smoke, find out from your caregiver how to quit. If you do not use tobacco, do not start.  Lung cancer screening is recommended for adults aged 55 80 years who are at high risk for developing lung cancer because of a history of smoking. Yearly low-dose computed tomography (CT) is recommended for people who have at least a 30-pack-year history of smoking and are a current smoker or have quit within the past 15 years. A pack year of smoking is smoking an average of 1 pack of cigarettes a day for 1 year (for example: 1 pack a day for 30 years or 2 packs a day for 15 years). Yearly screening should continue until the smoker has stopped smoking for at least 15 years. Yearly screening should also be stopped for people who develop a health problem that would prevent them from having lung cancer treatment.  If you are pregnant, do not drink alcohol. If you are breastfeeding, be very cautious about drinking alcohol. If you are not pregnant and choose to drink alcohol, do not exceed 1 drink per day. One drink is considered to be 12 ounces (355 mL) of beer, 5 ounces (148 mL) of wine, or 1.5 ounces (44 mL) of liquor.  Avoid use of street drugs. Do not share needles with anyone. Ask for help if   need support or instructions about stopping the use of drugs.  High blood pressure causes heart disease and increases the risk of stroke. Blood pressure should be checked at least every 1 to 2 years. Ongoing high blood pressure should be treated with medicines, if weight loss and exercise are not effective.  If you are 56 to 41 years old, ask your caregiver if you should take aspirin to prevent strokes.  Diabetes screening involves taking a blood sample to check your fasting blood sugar level.  This should be done once every 3 years, after age 70, if you are within normal weight and without risk factors for diabetes. Testing should be considered at a younger age or be carried out more frequently if you are overweight and have at least 1 risk factor for diabetes.  Breast cancer screening is essential preventative care for women. You should practice "breast self-awareness." This means understanding the normal appearance and feel of your breasts and may include breast self-examination. Any changes detected, no matter how small, should be reported to a caregiver. Women in their 40s and 30s should have a clinical breast exam (CBE) by a caregiver as part of a regular health exam every 1 to 3 years. After age 69, women should have a CBE every year. Starting at age 28, women should consider having a mammogram (breast X-ray) every year. Women who have a family history of breast cancer should talk to their caregiver about genetic screening. Women at a high risk of breast cancer should talk to their caregiver about having an MRI and a mammogram every year.  Breast cancer gene (BRCA)-related cancer risk assessment is recommended for women who have family members with BRCA-related cancers. BRCA-related cancers include breast, ovarian, tubal, and peritoneal cancers. Having family members with these cancers may be associated with an increased risk for harmful changes (mutations) in the breast cancer genes BRCA1 and BRCA2. Results of the assessment will determine the need for genetic counseling and BRCA1 and BRCA2 testing.  The Pap test is a screening test for cervical cancer. Women should have a Pap test starting at age 47. Between ages 57 and 34, Pap tests should be repeated every 2 years. Beginning at age 72, you should have a Pap test every 3 years as long as the past 3 Pap tests have been normal. If you had a hysterectomy for a problem that was not cancer or a condition that could lead to cancer, then you no  longer need Pap tests. If you are between ages 74 and 63, and you have had normal Pap tests going back 10 years, you no longer need Pap tests. If you have had past treatment for cervical cancer or a condition that could lead to cancer, you need Pap tests and screening for cancer for at least 20 years after your treatment. If Pap tests have been discontinued, risk factors (such as a new sexual partner) need to be reassessed to determine if screening should be resumed. Some women have medical problems that increase the chance of getting cervical cancer. In these cases, your caregiver may recommend more frequent screening and Pap tests.  The human papillomavirus (HPV) test is an additional test that may be used for cervical cancer screening. The HPV test looks for the virus that can cause the cell changes on the cervix. The cells collected during the Pap test can be tested for HPV. The HPV test could be used to screen women aged 66 years and older, and should be  be used in women of any age who have unclear Pap test results. After the age of 30, women should have HPV testing at the same frequency as a Pap test.  Colorectal cancer can be detected and often prevented. Most routine colorectal cancer screening begins at the age of 50 and continues through age 75. However, your caregiver may recommend screening at an earlier age if you have risk factors for colon cancer. On a yearly basis, your caregiver may provide home test kits to check for hidden blood in the stool. Use of a small camera at the end of a tube, to directly examine the colon (sigmoidoscopy or colonoscopy), can detect the earliest forms of colorectal cancer. Talk to your caregiver about this at age 50, when routine screening begins. Direct examination of the colon should be repeated every 5 to 10 years through age 75, unless early forms of pre-cancerous polyps or small growths are found.  Hepatitis C blood testing is recommended for all people born from  1945 through 1965 and any individual with known risks for hepatitis C.  Practice safe sex. Use condoms and avoid high-risk sexual practices to reduce the spread of sexually transmitted infections (STIs). Sexually active women aged 25 and younger should be checked for Chlamydia, which is a common sexually transmitted infection. Older women with new or multiple partners should also be tested for Chlamydia. Testing for other STIs is recommended if you are sexually active and at increased risk.  Osteoporosis is a disease in which the bones lose minerals and strength with aging. This can result in serious bone fractures. The risk of osteoporosis can be identified using a bone density scan. Women ages 65 and over and women at risk for fractures or osteoporosis should discuss screening with their caregivers. Ask your caregiver whether you should be taking a calcium supplement or vitamin D to reduce the rate of osteoporosis.  Menopause can be associated with physical symptoms and risks. Hormone replacement therapy is available to decrease symptoms and risks. You should talk to your caregiver about whether hormone replacement therapy is right for you.  Use sunscreen. Apply sunscreen liberally and repeatedly throughout the day. You should seek shade when your shadow is shorter than you. Protect yourself by wearing long sleeves, pants, a wide-brimmed hat, and sunglasses year round, whenever you are outdoors.  Notify your caregiver of new moles or changes in moles, especially if there is a change in shape or color. Also notify your caregiver if a mole is larger than the size of a pencil eraser.  Stay current with your immunizations. Document Released: 05/22/2011 Document Revised: 03/03/2013 Document Reviewed: 05/22/2011 [employer] Patient Information 2014 [employer].   

## 2014-07-21 LAB — URINALYSIS W MICROSCOPIC + REFLEX CULTURE
BILIRUBIN URINE: NEGATIVE
CASTS: NONE SEEN
CRYSTALS: NONE SEEN
Glucose, UA: NEGATIVE mg/dL
KETONES UR: NEGATIVE mg/dL
Nitrite: NEGATIVE
PH: 6 (ref 5.0–8.0)
Protein, ur: NEGATIVE mg/dL
SPECIFIC GRAVITY, URINE: 1.013 (ref 1.005–1.030)
Urobilinogen, UA: 0.2 mg/dL (ref 0.0–1.0)

## 2014-07-21 LAB — PROLACTIN: Prolactin: 5.7 ng/mL

## 2014-07-21 LAB — FOLLICLE STIMULATING HORMONE: FSH: 5 m[IU]/mL

## 2014-07-22 LAB — URINE CULTURE: Colony Count: 30000

## 2014-07-29 ENCOUNTER — Encounter: Payer: Self-pay | Admitting: Gynecology

## 2014-07-29 ENCOUNTER — Ambulatory Visit (INDEPENDENT_AMBULATORY_CARE_PROVIDER_SITE_OTHER): Payer: Managed Care, Other (non HMO)

## 2014-07-29 ENCOUNTER — Other Ambulatory Visit: Payer: Self-pay | Admitting: Gynecology

## 2014-07-29 ENCOUNTER — Ambulatory Visit (INDEPENDENT_AMBULATORY_CARE_PROVIDER_SITE_OTHER): Payer: Managed Care, Other (non HMO) | Admitting: Gynecology

## 2014-07-29 DIAGNOSIS — N83209 Unspecified ovarian cyst, unspecified side: Secondary | ICD-10-CM

## 2014-07-29 DIAGNOSIS — IMO0002 Reserved for concepts with insufficient information to code with codable children: Secondary | ICD-10-CM

## 2014-07-29 DIAGNOSIS — N930 Postcoital and contact bleeding: Secondary | ICD-10-CM

## 2014-07-29 DIAGNOSIS — N925 Other specified irregular menstruation: Secondary | ICD-10-CM

## 2014-07-29 DIAGNOSIS — N949 Unspecified condition associated with female genital organs and menstrual cycle: Secondary | ICD-10-CM

## 2014-07-29 DIAGNOSIS — N809 Endometriosis, unspecified: Secondary | ICD-10-CM

## 2014-07-29 DIAGNOSIS — N938 Other specified abnormal uterine and vaginal bleeding: Secondary | ICD-10-CM

## 2014-07-29 NOTE — Patient Instructions (Signed)
Follow up with your decision as far as surgery. Otherwise follow up in 2-3 months to relook at the left ovary with ultrasound.

## 2014-07-29 NOTE — Progress Notes (Signed)
Christina Ramsey [birth year] [id number]        43 y.o.  [id number] presents for ultrasound in follow up of her dyspareunia, postcoital bleeding, history of endometriosis and prior left ovarian cyst on ultrasound last year that she was to follow up in 2 months for repeat ultrasound but failed to do so.  Past medical history,surgical history, problem list, medications, allergies, family history and social history were all reviewed and documented in the EPIC chart.  Directed ROS with pertinent positives and negatives documented in the history of present illness/assessment and plan.  Ultrasound shows uterus normal size with IUD in the proper location. Endometrial echo of 5.0 mm. Right ovary normal. The left ovary with echo-free a vascular cyst 33 mm mean. Cul-de-sac negative.  Assessment/Plan:  43 y.o. [id number] with persistent left ovarian cyst. Question physiologic versus possible endometrioma or other tumor. Patient's main issues is bleeding after intercourse and pain with intercourse. Laparoscopic proven endometriosis 2013. Options for management discussed to include observation with repeat ultrasound in several months to relook at the left ovarian cyst, more aggressive medical suppression such as progesterone/Depo-Lupron recognizing she has the Mirena IUD now with light menses but consistent bleeding after intercourse. Removal of the IUD with reassessment of her bleeding if continues. Surgery to include conservative such as repeat laparoscopy or definitive such as LAVH. The pros/cons of each choice reviewed with the patient. I feel that if she would proceed with surgery then I would recommend a definitive LAVH versus a repeat laparoscopy given future childbearing is not an issue with her and she's had laparoscopies in the past. No guarantees as far as pain relief reviewed but certainly would relieve bleeding and probably her pain given the retroversion of her uterus and deep dyspareunia. She is known to have  prominent vascularity on the right fundus of the uterus noted that her past laparoscopy. The patient will think of her options and will call me with what she decides. If she does decide on conservative then the need for follow up ultrasound was stressed.   Note: This document was prepared with digital dictation and possible smart phrase technology. Any transcriptional errors that result from this process are unintentional.   Anastasio Auerbach MD, 10:44 AM 07/29/2014

## 2014-07-31 ENCOUNTER — Telehealth: Payer: Self-pay

## 2014-07-31 NOTE — Telephone Encounter (Signed)
Patient said she is calling to schedule surgery based on her ultrasound this week.

## 2014-07-31 NOTE — Telephone Encounter (Signed)
Surgery slip sent 

## 2014-09-21 ENCOUNTER — Encounter: Payer: Self-pay | Admitting: Gynecology

## 2014-10-12 ENCOUNTER — Encounter: Payer: Self-pay | Admitting: Gynecology

## 2014-10-12 ENCOUNTER — Institutional Professional Consult (permissible substitution): Payer: Managed Care, Other (non HMO) | Admitting: Gynecology

## 2014-10-12 ENCOUNTER — Ambulatory Visit (INDEPENDENT_AMBULATORY_CARE_PROVIDER_SITE_OTHER): Payer: Managed Care, Other (non HMO) | Admitting: Gynecology

## 2014-10-12 VITALS — BP 110/74 | Wt 156.0 lb

## 2014-10-12 DIAGNOSIS — N941 Dyspareunia: Secondary | ICD-10-CM

## 2014-10-12 DIAGNOSIS — R102 Pelvic and perineal pain: Secondary | ICD-10-CM

## 2014-10-12 DIAGNOSIS — IMO0002 Reserved for concepts with insufficient information to code with codable children: Secondary | ICD-10-CM

## 2014-10-12 DIAGNOSIS — N926 Irregular menstruation, unspecified: Secondary | ICD-10-CM

## 2014-10-12 NOTE — Patient Instructions (Signed)
Followup for surgery as scheduled. 

## 2014-10-12 NOTE — Progress Notes (Signed)
Christina Ramsey May 02, 1971 [id number]   Preoperative consult  Chief complaint: pelvic pain, dyspareunia, irregular bleeding  History of present illness: 43 y.o. [id number] with a long history of pelvic pain, dyspareunia and irregular bleeding. Patient had laparoscopy 2013 where superficial endometriosis was found and treated. She also had dilated vessels in the right cornual region of the uterus. Did well initially after this but has now resumed with pain with intercourse consistently, intermittent pelvic cramping and discomfort although not having regular menses with Mirena IUD placed 2012 and sporadic bleeding on and off throughout the month.  Evaluation included a normal FSH prolactin and TSH.  Ultrasound showed uterus grossly normal size and echotexture. IUD within the normal position right ovary normal. Left ovary with echo-free a vascular 38 mm cyst and cul-de-sac was negative for fluid.  Options for management were reviewed to include observation, more aggressive medical suppression, removal of her Mirena IUD with observation, surgery to include laparoscopy up to and including hysterectomy and the patient was to proceed with hysterectomy.  Patient is admitted for LAVH.  Past medical history,surgical history, medications, allergies, family history and social history were all reviewed and documented in the EPIC chart.  ROS:  Was performed and pertinent positives and negatives are included in the history of present illness.  Exam:  [name] assistant  General: well developed, well nourished female, no acute distress HEENT: normal  Lungs: clear to auscultation without wheezing, rales or rhonchi  Cardiac: regular rate without rubs, murmurs or gallops  Abdomen: soft, nontender without masses, guarding, rebound, organomegaly  Pelvic: external bus vagina: normal   Cervix: grossly normal, IUD string visualized Uterus: retroverted, normal size, midline and mobile, nontender  Adnexa: without masses or  tenderness      Assessment/Plan:  43 y.o. [id number] with persistent deep dyspareunia, intermittent pelvic pain, irregular bleeding in a patient with a history of laparoscopic proven endometriosis currently using Mirena IUD. Patient is admitted for LAVH. She understands that at any time during the procedure I may convert to an open procedure with a TAH if I feel it is unsafe to proceed laparoscopically or complications arise. She understands that this will mean a larger incision with a longer recovery. The absolute and irreversible sterility associated with hysterectomy was reviewed understood and accepted. Sexuality following hysterectomy was also discussed and the risks of persistent orgasmic dysfunction as well as persistent dyspareunia were reviewed. The expected intraoperative/postoperative courses and the recovery period were reviewed.  The risks to include infection requiring prolonged antibiotics or reoperation for abscess/hematoma drainage was discussed. The risk of hemorrhage necessitating transfusion and the risks of transfusion discussed to include transfusion reaction, hepatitis, HIV, mad cow disease and other unknown entities. Incisional complications requiring opening and draining of incisions, closure by secondary intention, long-term issues such as keloid/cosmetics and hernia formation were discussed. The risk of inadvertent injury to internal organs including bowel, bladder, ureters, vessels and nerves either immediately recognized or delay recognized necessitating major exploratory reparative surgeries and future reparative surgeries including ostomy formation, bowel resection, bladder repair, ureteral damage repair was all discussed understood and accepted. Patient understands there are no guarantees as far as pain relief and that her pain may persist, worsen or change following the procedure. The issues of ovarian conservation was also reviewed and the patient strongly wants to keep her ovaries  but does give me permission to remove one or both ovaries as an intraoperative decision if significant disease or complications arise. She understands the long-term risks as far as reoperation,  pain, cysts and ovarian cancer by retaining her ovaries. The patient's questions were answered to her satisfaction and she is ready to proceed with surgery.    Anastasio Auerbach MD, 3:19 PM 10/12/2014

## 2014-10-12 NOTE — H&P (Signed)
Christina Ramsey August 15, 1971 [id number]   History and Physical  Chief complaint: pelvic pain, dyspareunia, irregular bleeding  History of present illness: 43 y.o. [id number] with a long history of pelvic pain, dyspareunia and irregular bleeding. Patient had laparoscopy 2013 where superficial endometriosis was found and treated. She also had dilated vessels in the right cornual region of the uterus. Did well initially after this but has now resumed with pain with intercourse consistently, intermittent pelvic cramping and discomfort although not having regular menses with Mirena IUD placed 2012 and sporadic bleeding on and off throughout the month.  Evaluation included a normal FSH prolactin and TSH.  Ultrasound showed uterus grossly normal size and echotexture. IUD within the normal position right ovary normal. Left ovary with echo-free a vascular 38 mm cyst and cul-de-sac was negative for fluid.  Options for management were reviewed to include observation, more aggressive medical suppression, removal of her Mirena IUD with observation, surgery to include laparoscopy up to and including hysterectomy and the patient was to proceed with hysterectomy.  Patient is admitted for LAVH.  Past medical history,surgical history, medications, allergies, family history and social history were all reviewed and documented in the EPIC chart.  ROS:  Was performed and pertinent positives and negatives are included in the history of present illness.  Exam:  [name] assistant  General: well developed, well nourished female, no acute distress HEENT: normal  Lungs: clear to auscultation without wheezing, rales or rhonchi  Cardiac: regular rate without rubs, murmurs or gallops  Abdomen: soft, nontender without masses, guarding, rebound, organomegaly  Pelvic: external bus vagina: normal   Cervix: grossly normal, IUD string visualized Uterus: retroverted, normal size, midline and mobile, nontender  Adnexa: without masses  or tenderness      Assessment/Plan:  43 y.o. [id number] with persistent deep dyspareunia, intermittent pelvic pain, irregular bleeding in a patient with a history of laparoscopic proven endometriosis currently using Mirena IUD. Patient is admitted for LAVH. She understands that at any time during the procedure I may convert to an open procedure with a TAH if I feel it is unsafe to proceed laparoscopically or complications arise. She understands that this will mean a larger incision with a longer recovery. The absolute and irreversible sterility associated with hysterectomy was reviewed understood and accepted. Sexuality following hysterectomy was also discussed and the risks of persistent orgasmic dysfunction as well as persistent dyspareunia were reviewed. The expected intraoperative/postoperative courses and the recovery period were reviewed.  The risks to include infection requiring prolonged antibiotics or reoperation for abscess/hematoma drainage was discussed. The risk of hemorrhage necessitating transfusion and the risks of transfusion discussed to include transfusion reaction, hepatitis, HIV, mad cow disease and other unknown entities. Incisional complications requiring opening and draining of incisions, closure by secondary intention, long-term issues such as keloid/cosmetics and hernia formation were discussed. The risk of inadvertent injury to internal organs including bowel, bladder, ureters, vessels and nerves either immediately recognized or delay recognized necessitating major exploratory reparative surgeries and future reparative surgeries including ostomy formation, bowel resection, bladder repair, ureteral damage repair was all discussed understood and accepted. Patient understands there are no guarantees as far as pain relief and that her pain may persist, worsen or change following the procedure. The issues of ovarian conservation was also reviewed and the patient strongly wants to keep her  ovaries but does give me permission to remove one or both ovaries as an intraoperative decision if significant disease or complications arise. She understands the long-term risks as far as  reoperation, pain, cysts and ovarian cancer by retaining her ovaries. The patient's questions were answered to her satisfaction and she is ready to proceed with surgery.    Anastasio Auerbach MD, 3:51 PM 10/12/2014

## 2014-10-16 ENCOUNTER — Encounter (HOSPITAL_COMMUNITY)
Admission: RE | Admit: 2014-10-16 | Discharge: 2014-10-16 | Disposition: A | Discharge status: Home / Self Care | Payer: Managed Care, Other (non HMO) | Source: Ambulatory Visit | Attending: Gynecology | Admitting: Gynecology

## 2014-10-16 ENCOUNTER — Encounter (HOSPITAL_COMMUNITY): Payer: Self-pay

## 2014-10-16 DIAGNOSIS — Z01812 Encounter for preprocedural laboratory examination: Secondary | ICD-10-CM | POA: Insufficient documentation

## 2014-10-16 HISTORY — DX: Other specified health status: Z78.9

## 2014-10-16 LAB — CBC
HEMATOCRIT: 44.7 % (ref 36.0–46.0)
HEMOGLOBIN: 15.2 g/dL — AB (ref 12.0–15.0)
MCH: 29.8 pg (ref 26.0–34.0)
MCHC: 34 g/dL (ref 30.0–36.0)
MCV: 87.6 fL (ref 78.0–100.0)
Platelets: 285 10*3/uL (ref 150–400)
RBC: 5.1 MIL/uL (ref 3.87–5.11)
RDW: 12.6 % (ref 11.5–15.5)
WBC: 7.2 10*3/uL (ref 4.0–10.5)

## 2014-10-16 NOTE — Patient Instructions (Addendum)
[facility] - Preparing for Surgery  Before surgery, you can play an important role.  Because skin is not sterile, your skin needs to be as free of germs as possible.  You can reduce the number of germs on you skin by washing with CHG (chlorahexidine gluconate) soap before surgery.  CHG is an antiseptic cleaner which kills germs and bonds with the skin to continue killing germs even after washing.  Please DO NOT use if you have an allergy to CHG or antibacterial soaps.  If your skin becomes reddened/irritated stop using the CHG and inform your nurse when you arrive at Short Stay.  Do not shave (including legs and underarms) for at least 48 hours prior to the first CHG shower.  You may shave your face.  Please follow these instructions carefully:   1.  Shower with CHG Soap the night before surgery and the                                morning of Surgery.  2.  If you choose to wash your hair, wash your hair first as usual with your       normal shampoo.  3.  After you shampoo, rinse your hair and body thoroughly to remove the                      Shampoo.  4.  Use CHG as you would any other liquid soap.  You can apply chg directly       to the skin and wash gently with scrungie or a clean washcloth.  5.  Apply the CHG Soap to your body ONLY FROM THE NECK DOWN.        Do not use on open wounds or open sores.  Avoid contact with your eyes,       ears, mouth and genitals (private parts).  Wash genitals (private parts)       with your normal soap.  6.  Wash thoroughly, paying special attention to the area where your surgery        will be performed.  7.  Thoroughly rinse your body with warm water from the neck down.  8.  DO NOT shower/wash with your normal soap after using and rinsing off       the CHG Soap.  9.  Pat yourself dry with a clean towel.            10.  Wear clean pajamas.            11.  Place clean sheets on your bed the night of your first shower and do not        sleep with  pets.  Day of Surgery  Do not apply any lotions/deoderants the morning of surgery.  Please wear clean clothes to the hospital/surgery center.  Your procedure is scheduled on:10/20/14  Enter through the Main Entrance at :[facility] up desk phone and dial [phone] and inform us of your arrival.  Please call [phone] if you have any problems the morning of surgery.  Remember: Do not eat food or drink liquids, including water, after midnight: Monday   You may brush your teeth the morning of surgery.   DO NOT wear jewelry, eye make-up, lipstick,body lotion, or dark fingernail polish.  (Polished toes are ok) You may wear deodorant.  If you are to be admitted after surgery, leave suitcase in  car until your room has been assigned. Patients discharged on the day of surgery will not be allowed to drive home. Wear loose fitting, comfortable clothes for your ride home.

## 2014-10-19 MED ORDER — DEXTROSE 5 % IV SOLN
2.0000 g | INTRAVENOUS | Status: AC
  Administered 2014-10-20: 2 g via INTRAVENOUS
  Filled 2014-10-19: qty 2

## 2014-10-20 ENCOUNTER — Encounter (HOSPITAL_COMMUNITY): Payer: Self-pay | Admitting: *Deleted

## 2014-10-20 ENCOUNTER — Ambulatory Visit (HOSPITAL_COMMUNITY): Payer: Managed Care, Other (non HMO) | Admitting: Anesthesiology

## 2014-10-20 ENCOUNTER — Encounter (HOSPITAL_COMMUNITY)
Admission: RE | Disposition: A | Discharge status: Home / Self Care | Payer: Self-pay | Source: Ambulatory Visit | Attending: Gynecology

## 2014-10-20 ENCOUNTER — Ambulatory Visit (HOSPITAL_COMMUNITY)
Admission: RE | Admit: 2014-10-20 | Discharge: 2014-10-21 | Disposition: A | Discharge status: Home / Self Care | Payer: Managed Care, Other (non HMO) | Source: Ambulatory Visit | Attending: Gynecology | Admitting: Gynecology

## 2014-10-20 DIAGNOSIS — N938 Other specified abnormal uterine and vaginal bleeding: Secondary | ICD-10-CM

## 2014-10-20 DIAGNOSIS — R102 Pelvic and perineal pain: Principal | ICD-10-CM | POA: Diagnosis present

## 2014-10-20 DIAGNOSIS — N83202 Unspecified ovarian cyst, left side: Secondary | ICD-10-CM

## 2014-10-20 DIAGNOSIS — IMO0002 Reserved for concepts with insufficient information to code with codable children: Secondary | ICD-10-CM

## 2014-10-20 DIAGNOSIS — Z0289 Encounter for other administrative examinations: Secondary | ICD-10-CM

## 2014-10-20 DIAGNOSIS — Z30432 Encounter for removal of intrauterine contraceptive device: Secondary | ICD-10-CM

## 2014-10-20 DIAGNOSIS — N926 Irregular menstruation, unspecified: Secondary | ICD-10-CM

## 2014-10-20 DIAGNOSIS — N809 Endometriosis, unspecified: Secondary | ICD-10-CM

## 2014-10-20 DIAGNOSIS — N8320 Unspecified ovarian cysts: Secondary | ICD-10-CM | POA: Diagnosis not present

## 2014-10-20 DIAGNOSIS — N800 Endometriosis of uterus: Secondary | ICD-10-CM | POA: Diagnosis not present

## 2014-10-20 DIAGNOSIS — N941 Dyspareunia: Secondary | ICD-10-CM | POA: Diagnosis not present

## 2014-10-20 HISTORY — PX: LAPAROSCOPIC ASSISTED VAGINAL HYSTERECTOMY: SHX5398

## 2014-10-20 HISTORY — PX: CYSTOSCOPY: SHX5120

## 2014-10-20 LAB — HCG, SERUM, QUALITATIVE: Preg, Serum: NEGATIVE

## 2014-10-20 SURGERY — HYSTERECTOMY, VAGINAL, LAPAROSCOPY-ASSISTED
Anesthesia: General | Site: Urethra

## 2014-10-20 MED ORDER — SERTRALINE HCL 50 MG PO TABS
50.0000 mg | ORAL_TABLET | Freq: Every day | ORAL | Status: DC
  Administered 2014-10-20 – 2014-10-21 (×2): 50 mg via ORAL
  Filled 2014-10-20 (×3): qty 1

## 2014-10-20 MED ORDER — OXYCODONE-ACETAMINOPHEN 5-325 MG PO TABS
1.0000 | ORAL_TABLET | ORAL | Status: DC | PRN
  Administered 2014-10-20 – 2014-10-21 (×2): 1 via ORAL
  Administered 2014-10-21: 2 via ORAL
  Filled 2014-10-20 (×2): qty 1
  Filled 2014-10-20: qty 2

## 2014-10-20 MED ORDER — ARTIFICIAL TEARS OP OINT
TOPICAL_OINTMENT | OPHTHALMIC | Status: AC
  Filled 2014-10-20: qty 7

## 2014-10-20 MED ORDER — KETOROLAC TROMETHAMINE 30 MG/ML IJ SOLN
INTRAMUSCULAR | Status: AC
Start: 2014-10-20 — End: 2014-10-20
  Filled 2014-10-20: qty 1

## 2014-10-20 MED ORDER — ONDANSETRON HCL 4 MG/2ML IJ SOLN
INTRAMUSCULAR | Status: AC
  Filled 2014-10-20: qty 2

## 2014-10-20 MED ORDER — FENTANYL CITRATE 0.05 MG/ML IJ SOLN
INTRAMUSCULAR | Status: AC
  Filled 2014-10-20: qty 5

## 2014-10-20 MED ORDER — INFLUENZA VAC SPLIT QUAD 0.5 ML IM SUSY
0.5000 mL | PREFILLED_SYRINGE | INTRAMUSCULAR | Status: AC
  Administered 2014-10-21: 0.5 mL via INTRAMUSCULAR
  Filled 2014-10-20: qty 0.5

## 2014-10-20 MED ORDER — ROCURONIUM BROMIDE 100 MG/10ML IV SOLN
INTRAVENOUS | Status: AC
  Filled 2014-10-20: qty 1

## 2014-10-20 MED ORDER — DEXAMETHASONE SODIUM PHOSPHATE 10 MG/ML IJ SOLN
INTRAMUSCULAR | Status: AC
Start: 2014-10-20 — End: 2014-10-20
  Filled 2014-10-20: qty 1

## 2014-10-20 MED ORDER — LACTATED RINGERS IV SOLN
INTRAVENOUS | Status: DC
  Administered 2014-10-20 (×3): via INTRAVENOUS

## 2014-10-20 MED ORDER — HYDROMORPHONE HCL 1 MG/ML IJ SOLN
INTRAMUSCULAR | Status: DC | PRN
  Administered 2014-10-20: 1 mg via INTRAVENOUS

## 2014-10-20 MED ORDER — MIDAZOLAM HCL 2 MG/2ML IJ SOLN
INTRAMUSCULAR | Status: DC | PRN
Start: 2014-10-20 — End: 2014-10-20
  Administered 2014-10-20: 2 mg via INTRAVENOUS

## 2014-10-20 MED ORDER — ONDANSETRON HCL 4 MG/2ML IJ SOLN
4.0000 mg | Freq: Four times a day (QID) | INTRAMUSCULAR | Status: DC | PRN
  Administered 2014-10-20: 4 mg via INTRAVENOUS
  Filled 2014-10-20: qty 2

## 2014-10-20 MED ORDER — NEOSTIGMINE METHYLSULFATE 10 MG/10ML IV SOLN
INTRAVENOUS | Status: DC | PRN
Start: 2014-10-20 — End: 2014-10-20
  Administered 2014-10-20: 3 mg via INTRAVENOUS

## 2014-10-20 MED ORDER — METHYLENE BLUE 1 % INJ SOLN
INTRAMUSCULAR | Status: AC
  Filled 2014-10-20: qty 10

## 2014-10-20 MED ORDER — KETOROLAC TROMETHAMINE 30 MG/ML IJ SOLN
INTRAMUSCULAR | Status: DC | PRN
  Administered 2014-10-20: 30 mg via INTRAVENOUS

## 2014-10-20 MED ORDER — MEPERIDINE HCL 25 MG/ML IJ SOLN
6.2500 mg | INTRAMUSCULAR | Status: DC | PRN
Start: 2014-10-20 — End: 2014-10-20

## 2014-10-20 MED ORDER — MORPHINE SULFATE 4 MG/ML IJ SOLN
1.0000 mg | INTRAMUSCULAR | Status: DC | PRN
  Administered 2014-10-20: 2 mg via INTRAVENOUS
  Filled 2014-10-20: qty 1

## 2014-10-20 MED ORDER — ONDANSETRON HCL 4 MG PO TABS: 4.0000 mg | ORAL_TABLET | Freq: Four times a day (QID) | ORAL | Status: DC | PRN

## 2014-10-20 MED ORDER — LIDOCAINE-EPINEPHRINE 1 %-1:100000 IJ SOLN
INTRAMUSCULAR | Status: AC
  Filled 2014-10-20: qty 1

## 2014-10-20 MED ORDER — PROPOFOL 10 MG/ML IV BOLUS
INTRAVENOUS | Status: DC | PRN
  Administered 2014-10-20: 150 mg via INTRAVENOUS

## 2014-10-20 MED ORDER — GLYCOPYRROLATE 0.2 MG/ML IJ SOLN
INTRAMUSCULAR | Status: DC | PRN
  Administered 2014-10-20: .6 mg via INTRAVENOUS

## 2014-10-20 MED ORDER — HYDROMORPHONE HCL 1 MG/ML IJ SOLN: 0.2500 mg | INTRAMUSCULAR | Status: DC | PRN

## 2014-10-20 MED ORDER — SCOPOLAMINE 1 MG/3DAYS TD PT72
1.0000 | MEDICATED_PATCH | Freq: Once | TRANSDERMAL | Status: DC
  Administered 2014-10-20: 1.5 mg via TRANSDERMAL

## 2014-10-20 MED ORDER — PROPOFOL 10 MG/ML IV EMUL
INTRAVENOUS | Status: AC
  Filled 2014-10-20: qty 20

## 2014-10-20 MED ORDER — BUPIVACAINE HCL (PF) 0.25 % IJ SOLN
INTRAMUSCULAR | Status: DC | PRN
  Administered 2014-10-20: 8 mL

## 2014-10-20 MED ORDER — LACTATED RINGERS IR SOLN
Status: DC | PRN
  Administered 2014-10-20: 3000 mL

## 2014-10-20 MED ORDER — BUPIVACAINE HCL (PF) 0.25 % IJ SOLN
INTRAMUSCULAR | Status: AC
  Filled 2014-10-20: qty 30

## 2014-10-20 MED ORDER — DIPHENHYDRAMINE HCL 50 MG/ML IJ SOLN
INTRAMUSCULAR | Status: DC | PRN
  Administered 2014-10-20: 12.5 mg via INTRAVENOUS

## 2014-10-20 MED ORDER — FENTANYL CITRATE 0.05 MG/ML IJ SOLN
INTRAMUSCULAR | Status: DC | PRN
  Administered 2014-10-20 (×3): 50 ug via INTRAVENOUS

## 2014-10-20 MED ORDER — SCOPOLAMINE 1 MG/3DAYS TD PT72
MEDICATED_PATCH | TRANSDERMAL | Status: AC
  Filled 2014-10-20: qty 1

## 2014-10-20 MED ORDER — ONDANSETRON HCL 4 MG/2ML IJ SOLN
INTRAMUSCULAR | Status: DC | PRN
  Administered 2014-10-20: 4 mg via INTRAVENOUS

## 2014-10-20 MED ORDER — METOCLOPRAMIDE HCL 5 MG/ML IJ SOLN: 10.0000 mg | Freq: Once | INTRAMUSCULAR | Status: DC | PRN

## 2014-10-20 MED ORDER — ROCURONIUM BROMIDE 100 MG/10ML IV SOLN
INTRAVENOUS | Status: DC | PRN
  Administered 2014-10-20: 30 mg via INTRAVENOUS

## 2014-10-20 MED ORDER — DEXAMETHASONE SODIUM PHOSPHATE 10 MG/ML IJ SOLN
INTRAMUSCULAR | Status: DC | PRN
  Administered 2014-10-20: 10 mg via INTRAVENOUS

## 2014-10-20 MED ORDER — MIDAZOLAM HCL 2 MG/2ML IJ SOLN
INTRAMUSCULAR | Status: AC
  Filled 2014-10-20: qty 2

## 2014-10-20 MED ORDER — LIDOCAINE HCL (CARDIAC) 20 MG/ML IV SOLN
INTRAVENOUS | Status: AC
  Filled 2014-10-20: qty 5

## 2014-10-20 MED ORDER — LIDOCAINE-EPINEPHRINE 1 %-1:100000 IJ SOLN
INTRAMUSCULAR | Status: DC | PRN
  Administered 2014-10-20: 10 mL

## 2014-10-20 MED ORDER — KETOROLAC TROMETHAMINE 30 MG/ML IJ SOLN: 30.0000 mg | Freq: Four times a day (QID) | INTRAMUSCULAR | Status: DC

## 2014-10-20 MED ORDER — KETOROLAC TROMETHAMINE 30 MG/ML IJ SOLN
30.0000 mg | Freq: Four times a day (QID) | INTRAMUSCULAR | Status: DC
  Administered 2014-10-20 – 2014-10-21 (×3): 30 mg via INTRAVENOUS
  Filled 2014-10-20 (×3): qty 1

## 2014-10-20 MED ORDER — HYDROMORPHONE HCL 1 MG/ML IJ SOLN
INTRAMUSCULAR | Status: AC
  Filled 2014-10-20: qty 1

## 2014-10-20 MED ORDER — DEXTROSE-NACL 5-0.9 % IV SOLN
INTRAVENOUS | Status: DC
  Administered 2014-10-20 – 2014-10-21 (×2): via INTRAVENOUS

## 2014-10-20 MED ORDER — STERILE WATER FOR IRRIGATION IR SOLN
Status: DC | PRN
  Administered 2014-10-20: 1000 mL

## 2014-10-20 MED ORDER — METHYLENE BLUE 1 % INJ SOLN
INTRAMUSCULAR | Status: DC | PRN
  Administered 2014-10-20: 7 mL via INTRAVENOUS

## 2014-10-20 MED ORDER — DIPHENHYDRAMINE HCL 25 MG PO CAPS: 50.0000 mg | ORAL_CAPSULE | Freq: Four times a day (QID) | ORAL | Status: DC | PRN

## 2014-10-20 MED ORDER — GLYCOPYRROLATE 0.2 MG/ML IJ SOLN
INTRAMUSCULAR | Status: AC
  Filled 2014-10-20: qty 3

## 2014-10-20 MED ORDER — NEOSTIGMINE METHYLSULFATE 10 MG/10ML IV SOLN
INTRAVENOUS | Status: AC
  Filled 2014-10-20: qty 1

## 2014-10-20 MED ORDER — LIDOCAINE HCL (CARDIAC) 20 MG/ML IV SOLN
INTRAVENOUS | Status: DC | PRN
  Administered 2014-10-20: 80 mg via INTRAVENOUS

## 2014-10-20 SURGICAL SUPPLY — 56 items
BARRIER ADHS 3X4 INTERCEED (GAUZE/BANDAGES/DRESSINGS) IMPLANT
BLADE SURG 10 STRL SS (BLADE) ×6 IMPLANT
BLADE SURG 11 STRL SS (BLADE) ×12 IMPLANT
BLADE SURG 15 STRL LF C SS BP (BLADE) ×4 IMPLANT
BLADE SURG 15 STRL SS (BLADE) ×2
CABLE HIGH FREQUENCY MONO STRZ (ELECTRODE) IMPLANT
CATH ROBINSON RED A/P 16FR (CATHETERS) ×6 IMPLANT
CLOSURE WOUND 1/4 X3 (GAUZE/BANDAGES/DRESSINGS)
CLOTH BEACON ORANGE TIMEOUT ST (SAFETY) ×6 IMPLANT
CONT PATH 16OZ SNAP LID 3702 (MISCELLANEOUS) ×6 IMPLANT
COVER BACK TABLE 60X90IN (DRAPES) ×6 IMPLANT
COVER MAYO STAND STRL (DRAPES) ×6 IMPLANT
DECANTER SPIKE VIAL GLASS SM (MISCELLANEOUS) ×12 IMPLANT
DRSG COVADERM PLUS 2X2 (GAUZE/BANDAGES/DRESSINGS) ×12 IMPLANT
DRSG OPSITE POSTOP 3X4 (GAUZE/BANDAGES/DRESSINGS) ×6 IMPLANT
DURAPREP 26ML APPLICATOR (WOUND CARE) ×6 IMPLANT
ELECT REM PT RETURN 9FT ADLT (ELECTROSURGICAL) ×6
ELECTRODE REM PT RTRN 9FT ADLT (ELECTROSURGICAL) ×4 IMPLANT
EVACUATOR SMOKE 8.L (FILTER) ×6 IMPLANT
GLOVE BIO SURGEON STRL SZ7.5 (GLOVE) ×18 IMPLANT
GLOVE BIOGEL PI IND STRL 7.0 (GLOVE) ×8 IMPLANT
GLOVE BIOGEL PI INDICATOR 7.0 (GLOVE) ×4
GOWN STRL REUS W/ TWL LRG LVL3 (GOWN DISPOSABLE) ×28 IMPLANT
GOWN STRL REUS W/TWL LRG LVL3 (GOWN DISPOSABLE) ×26 IMPLANT
HEMOSTAT SURGICEL 4X8 (HEMOSTASIS) ×6 IMPLANT
LIQUID BAND (GAUZE/BANDAGES/DRESSINGS) ×6 IMPLANT
NEEDLE MAYO .5 CIRCLE (NEEDLE) IMPLANT
NS IRRIG 1000ML POUR BTL (IV SOLUTION) ×6 IMPLANT
PACK LAPAROSCOPY BASIN (CUSTOM PROCEDURE TRAY) ×6 IMPLANT
PACK LAVH (CUSTOM PROCEDURE TRAY) ×6 IMPLANT
PAD OB MATERNITY 4.3X12.25 (PERSONAL CARE ITEMS) ×6 IMPLANT
PAD TRENDELENBURG OR TABLE (MISCELLANEOUS) ×6 IMPLANT
POUCH SPECIMEN RETRIEVAL 10MM (ENDOMECHANICALS) IMPLANT
PROTECTOR NERVE ULNAR (MISCELLANEOUS) ×12 IMPLANT
SCISSORS LAP 5X35 DISP (ENDOMECHANICALS) IMPLANT
SET CYSTO W/LG BORE CLAMP LF (SET/KITS/TRAYS/PACK) ×6 IMPLANT
SET IRRIG TUBING LAPAROSCOPIC (IRRIGATION / IRRIGATOR) ×6 IMPLANT
SHEARS HARMONIC ACE PLUS 36CM (ENDOMECHANICALS) IMPLANT
SLEEVE XCEL OPT CAN 5 100 (ENDOMECHANICALS) ×6 IMPLANT
SOLUTION ELECTROLUBE (MISCELLANEOUS) IMPLANT
STRIP CLOSURE SKIN 1/4X3 (GAUZE/BANDAGES/DRESSINGS) IMPLANT
SUT PLAIN 4 0 FS 2 27 (SUTURE) ×6 IMPLANT
SUT VIC AB 0 CT1 18XCR BRD8 (SUTURE) ×12 IMPLANT
SUT VIC AB 0 CT1 36 (SUTURE) IMPLANT
SUT VIC AB 0 CT1 8-18 (SUTURE) ×6
SUT VIC AB 2-0 SH 27 (SUTURE) ×2
SUT VIC AB 2-0 SH 27XBRD (SUTURE) ×4 IMPLANT
SUT VICRYL 0 TIES 12 18 (SUTURE) ×6 IMPLANT
SUT VICRYL 0 UR6 27IN ABS (SUTURE) ×6 IMPLANT
SYR BULB IRRIGATION 50ML (SYRINGE) ×6 IMPLANT
TOWEL OR 17X24 6PK STRL BLUE (TOWEL DISPOSABLE) ×12 IMPLANT
TRAY FOLEY CATH 14FR (SET/KITS/TRAYS/PACK) ×6 IMPLANT
TROCAR XCEL NON-BLD 11X100MML (ENDOMECHANICALS) ×6 IMPLANT
TROCAR XCEL NON-BLD 5MMX100MML (ENDOMECHANICALS) ×6 IMPLANT
WARMER LAPAROSCOPE (MISCELLANEOUS) ×6 IMPLANT
WATER STERILE IRR 1000ML POUR (IV SOLUTION) ×6 IMPLANT

## 2014-10-20 NOTE — Transfer of Care (Signed)
Immediate Anesthesia Transfer of Care Note  Patient: Christina Ramsey  Procedure(s) Performed: Procedure(s): LAPAROSCOPIC ASSISTED VAGINAL HYSTERECTOMY WITH BILATERAL SALPINGECTOMY (N/A) CYSTOSCOPY (N/A)  Patient Location: PACU  Anesthesia Type:General  Level of Consciousness: awake, alert  and oriented  Airway & Oxygen Therapy: Patient Spontanous Breathing and Patient connected to nasal cannula oxygen  Post-op Assessment: Report given to PACU RN, Post -op Vital signs reviewed and stable and Patient moving all extremities  Post vital signs: Reviewed and stable  Complications: No apparent anesthesia complications

## 2014-10-20 NOTE — Anesthesia Postprocedure Evaluation (Signed)
  Anesthesia Post-op Note  Patient: Christina Ramsey  Procedure(s) Performed: Procedure(s): LAPAROSCOPIC ASSISTED VAGINAL HYSTERECTOMY WITH BILATERAL SALPINGECTOMY (N/A) CYSTOSCOPY (N/A)  Patient Location: PACU  Anesthesia Type:General  Level of Consciousness: awake, alert  and oriented  Airway and Oxygen Therapy: Patient Spontanous Breathing  Post-op Pain: mild  Post-op Assessment: Post-op Vital signs reviewed, Patient's Cardiovascular Status Stable, Respiratory Function Stable, Patent Airway, No signs of Nausea or vomiting and Pain level controlled  Post-op Vital Signs: Reviewed and stable  Last Vitals:  Filed Vitals:   10/20/14 1330  BP: 95/54  Pulse: 60  Temp:   Resp: 16    Complications: No apparent anesthesia complications

## 2014-10-20 NOTE — Anesthesia Postprocedure Evaluation (Signed)
  Anesthesia Post-op Note  Patient: Christina Ramsey  Procedure(s) Performed: Procedure(s): LAPAROSCOPIC ASSISTED VAGINAL HYSTERECTOMY WITH BILATERAL SALPINGECTOMY (N/A) CYSTOSCOPY (N/A)  Patient Location: PACU and Women's Unit  Anesthesia Type:General  Level of Consciousness: awake, alert  and oriented  Airway and Oxygen Therapy: Patient Spontanous Breathing  Post-op Pain: mild  Post-op Assessment: Post-op Vital signs reviewed  Post-op Vital Signs: Reviewed and stable  Last Vitals:  Filed Vitals:   10/20/14 1542  BP: 89/50  Pulse: 76  Temp: 36.8 C  Resp: 16    Complications: No apparent anesthesia complications

## 2014-10-20 NOTE — Addendum Note (Signed)
Addendum  created 10/20/14 1634 by Genevie Ann, CRNA   Modules edited: Notes Section   Notes Section:  File: [id number]

## 2014-10-20 NOTE — Op Note (Signed)
Christina Ramsey 10-Dec-1970 [id number]   Post Operative Note   Date of surgery:  10/20/2014  Pre Op Dx:  Pelvic pain, dyspareunia, irregular bleeding, history of endometriosis,left ovarian cyst  Post Op Dx:  Pelvic pain, dyspareunia, irregular bleeding, history of endometriosis  Procedure:  Laparoscopic-assisted vaginal hysterectomy, bilateral salpingectomies, cystoscopy, removal of Mirena IUD  Surgeon:  Anastasio Auerbach  Assistant:  Uvaldo Rising  Anesthesia:  General  EBL:  300 cc anesthesia reported  Complications:  None  Specimen:  #1 uterus with bilateral fallopian tubes #2 Mirena IUD (gross only) to pathology  Findings: EUA:  External BUS vagina normal. Cervix normal. Uterus retroverted normal size midline mobile. Adnexa without masses   Operative:  Anterior cul-de-sac with minimal vesicouterine peritoneal scarring. Posterior cul-de-sac negative. Uterus grossly normal size, retroverted with prominent vascularity right cornual region. Right and left fallopian tubes normal length caliber and fimbriated ends. Right and left ovaries grossly normal with no evidence of prior left ovarian cyst noted on ultrasound. No evidence of pelvic adhesions or endometriosis. Upper abdominal exam was normal noting appendix free and mobile. Liver smooth with no abnormalities. Gallbladder not visualized  Procedure:  The patient was taken to the operating room, underwent general anesthesia, was placed in the low dorsal lithotomy position, received a vaginal/perineal preparation with Betadine solution, indwelling Foley catheter placed and subsequently the patient received an abdominal preparation with DuraPrep all per nursing personnel. The timeout was performed by the surgical team. An EUA was performed, the cervix visualized with a speculum, anterior lip grasped with a single-tooth tenaculum, the Mirena IUD string grasped with a Kelly clamp and removed and a Hulka tenaculum was placed without  difficulty. The patient was draped in the usual fashion and a repeat infraumbilical transverse incision was made and the abdomen was directly entered using a 12 mm  AirSeal direct entry trocar without difficulty. The abdomen was insufflated and a right suprapubic 5 mm AirSeal trocar was placed under direct visualization after transillumination for the vessels without difficulty. The high flow attachment was then placed to the right suprapubic port and subsequently a left suprapubic 5 mm port was placed under direct visualization after transillumination for the vessels without difficulty. Examination of the pelvic organs and upper abdominal exam was carried out with findings noted above. The left fallopian tube was identified and elevated, the left ureter identified away from the surgical site and using the harmonic scalpel, the mesosalpinx was incised taking care to avoid the infundibulopelvic ligament and vessels. The uterine ovarian pedicle was then transected with the harmonic scalpel as was the left round ligament without difficulty. A similar procedure was carried out on the other side. The anterior vesicouterine peritoneal fold was then incised using the Harmonic scalpel and the bladder flap was bluntly developed without difficulty. Attention was then turned to the vaginal portion of the procedure and the patient was placed in the high dorsal lithotomy position, the Hulka tenaculum removed, a weighted speculum placed and the cervix grasped with a tenaculum. The cervical mucosa was then injected circumferentially using 1% lidocaine with 1:100,000 epinephrine dilution. The cervical mucosa was then circumferentially incised and the paracervical planes sharply developed without difficulty. The posterior cul-de-sac was then entered directly without difficulty and a long weighted speculum was placed. The right and left uterosacral ligaments were identified, clamped cut and ligated using 0 Vicryl suture and tagged  for future reference. The anterior vesicouterine plane was sharply and bluntly developed and the anterior cul-de-sac was ultimately entered without difficulty. The  uterus was then freed from its attachments through clamping cutting and ligating of the cardinal and parametrial tissues using 0 Vicryl suture. The uterus was then delivered through the vagina and the remaining attachments clamped cut and ligated using 0 Vicryl suture. At this point it was felt there was a fair amount of blood in the cul-de-sac and the question as to whether the patient was bleeding from a pedicle higher than can be seen and the vaginal mucosa was clamped using Allis clamps anterior to posterior, the abdomen reinsufflated and reinspected showing no active bleeding. The Allis clamps were then removed and a weighted speculum replaced and the posterior vaginal cuff was run from uterosacral ligament to uterosacral ligament using 0 Vicryl suture in a running interlocking stitch. The vagina was then closed anterior to posterior using 0 Vicryl suture in interrupted figure of 8 stitch with vaginal irrigation showing adequate hemostasis. The Foley catheter was then removed, the patient having previously received intravenous methylene blue and cystoscopy was performed which showed good bladder distention with no evidence of injury and bilateral ureteral jets. The operating team regloved and regowned and the abdomen was reinsufflated. Several small bleeding points at the vaginal cuff were addressed with bipolar cautery, the pelvis copiously irrigated with reinspection under low pressure showed adequate hemostasis from all pedicles and the vaginal cuff. Prophylactic Surgicel was placed along the vaginal cuff, the suprapubic ports removed under direct visualization showing adequate hemostasis and the infraumbilical port was backed out under direct visualization showing adequate hemostasis and no evidence of hernia formation. All skin incisions were  injected using 0.25% Marcaine, the infraumbilical port closed using 0 Vicryl suture in an interrupted subcutaneous/fascial stitch and all skin incisions closed using Dermabond type adhesive. Patient was then awakened without difficulty, taken to recovery room in good condition having tolerated procedure well.    Anastasio Auerbach MD, 1:07 PM 10/20/2014

## 2014-10-20 NOTE — H&P (Signed)
  The patient was examined.  I reviewed the proposed surgery and consent form with the patient.  The dictated history and physical is current and accurate and all questions were answered. The patient is ready to proceed with surgery and has a realistic understanding and expectation for the outcome.   Christina Auerbach MD, 8:53 AM 10/20/2014

## 2014-10-20 NOTE — Anesthesia Preprocedure Evaluation (Addendum)
Anesthesia Evaluation  Patient identified by MRN, date of birth, ID band Patient awake    Reviewed: Allergy & Precautions, H&P , NPO status   Airway Mallampati: II  TM Distance: >3 FB Neck ROM: Full    Dental no notable dental hx. (+) Teeth Intact   Pulmonary neg pulmonary ROS,  breath sounds clear to auscultation  Pulmonary exam normal       Cardiovascular negative cardio ROS  Rhythm:Regular Rate:Normal     Neuro/Psych  Headaches, negative psych ROS   GI/Hepatic negative GI ROS, Neg liver ROS,   Endo/Other  negative endocrine ROS  Renal/GU negative Renal ROS  negative genitourinary   Musculoskeletal negative musculoskeletal ROS (+)   Abdominal   Peds  Hematology negative hematology ROS (+)   Anesthesia Other Findings   Reproductive/Obstetrics Endometriosis Dyspareunia Left ovarian cyst DUB                             Anesthesia Physical Anesthesia Plan  ASA: II  Anesthesia Plan: General   Post-op Pain Management:    Induction: Intravenous  Airway Management Planned: Oral ETT  Additional Equipment:   Intra-op Plan:   Post-operative Plan: Extubation in OR  Informed Consent: I have reviewed the patients History and Physical, chart, labs and discussed the procedure including the risks, benefits and alternatives for the proposed anesthesia with the patient or authorized representative who has indicated his/her understanding and acceptance.   Dental advisory given  Plan Discussed with: Anesthesiologist, CRNA and Surgeon  Anesthesia Plan Comments:         Anesthesia Quick Evaluation

## 2014-10-20 NOTE — Progress Notes (Signed)
Patient ID: Christina Ramsey, female   DOB: 1971/02/28, 43 y.o.   MRN: [id number] Latrecia Capito Apr 19, 1971 [id number]   Day of Surgery s/p Procedure(s): LAPAROSCOPIC ASSISTED VAGINAL HYSTERECTOMY WITH BILATERAL SALPINGECTOMY CYSTOSCOPY  Subjective: Patient reports no complaints, no acute distress, pain severity reported mild, Yes.   taking PO, foley catheter in place, No. ambulating, No. passing flatus  Objective: Vital signs in last 24 hours: Temp:  [97.7 F (36.5 C)-98.3 F (36.8 C)] 98.3 F (36.8 C) (12/01 1542) Pulse Rate:  [59-76] 76 (12/01 1542) Resp:  [16-20] 16 (12/01 1542) BP: (89-104)/(36-59) 89/50 mmHg (12/01 1542) SpO2:  [97 %-100 %] 100 % (12/01 1542) Weight:  [155 lb (70.308 kg)] 155 lb (70.308 kg) (12/01 1555) Last BM Date: 10/18/14  EXAM General: awake, alert and no distress Resp: clear to auscultation bilaterally Cardio: regular rate and rhythm GI: soft, minimal tenderness, bowel sounds present, dressings dry intact Lower Extremities: Without swelling or tenderness  Lab Results:  No results for input(s): WBC, HGB, HCT, PLT in the last 72 hours.  Assessment: s/p Procedure(s): LAPAROSCOPIC ASSISTED VAGINAL HYSTERECTOMY WITH BILATERAL SALPINGECTOMY CYSTOSCOPY: stable and progressing well  Plan: Continue routine post operative care with tentative discharge in the AM   LOS: 0 days    Anastasio Auerbach MD, 5:16 PM 10/20/2014

## 2014-10-21 ENCOUNTER — Encounter (HOSPITAL_COMMUNITY): Payer: Self-pay | Admitting: Gynecology

## 2014-10-21 DIAGNOSIS — R102 Pelvic and perineal pain: Secondary | ICD-10-CM | POA: Diagnosis not present

## 2014-10-21 LAB — CBC
HCT: 34.4 % — ABNORMAL LOW (ref 36.0–46.0)
Hemoglobin: 11.8 g/dL — ABNORMAL LOW (ref 12.0–15.0)
MCH: 30.1 pg (ref 26.0–34.0)
MCHC: 34.3 g/dL (ref 30.0–36.0)
MCV: 87.8 fL (ref 78.0–100.0)
PLATELETS: 226 10*3/uL (ref 150–400)
RBC: 3.92 MIL/uL (ref 3.87–5.11)
RDW: 12.3 % (ref 11.5–15.5)
WBC: 14.7 10*3/uL — ABNORMAL HIGH (ref 4.0–10.5)

## 2014-10-21 MED ORDER — OXYCODONE-ACETAMINOPHEN 5-325 MG PO TABS: 1.0000 | ORAL_TABLET | ORAL | Status: DC | PRN

## 2014-10-21 NOTE — Plan of Care (Signed)
Problem: Consults Goal: GYN POST OP OBSV 2 days Patient Education (See Patient Education module for education specifics.)  Outcome: Completed/Met Date Met:  10/21/14

## 2014-10-21 NOTE — Discharge Instructions (Signed)

## 2014-10-21 NOTE — Progress Notes (Signed)
Patient ID: Christina Ramsey, female   DOB: Nov 23, 1970, 43 y.o.   MRN: [id number] Christina Ramsey [birth year] [id number]   1 Day Post-Op Procedure(s) (LRB): LAPAROSCOPIC ASSISTED VAGINAL HYSTERECTOMY WITH BILATERAL SALPINGECTOMY (N/A) CYSTOSCOPY (N/A)  Subjective: Patient reports no acute distress, pain severity reported mild, Yes.   taking PO, foley catheter out, No. voiding, Yes.   ambulating, Yes.   passing flatus  Objective: Vital signs in last 24 hours: Temp:  [97.7 F (36.5 C)-98.8 F (37.1 C)] 98.3 F (36.8 C) (12/02 0620) Pulse Rate:  [59-80] 68 (12/02 0620) Resp:  [16-20] 16 (12/02 0620) BP: (79-104)/(36-59) 88/50 mmHg (12/02 0620) SpO2:  [97 %-100 %] 100 % (12/02 0620) Weight:  [155 lb (70.308 kg)] 155 lb (70.308 kg) (12/01 1555) Last BM Date: 10/18/14    EXAM General: awake, alert and no distress Resp: clear to auscultation bilaterally Cardio: regular rate and rhythm GI: soft, minnimal tendernes, bowel sounds active, incisions dry intact Lower Extremities: Without swelling or tenderness Vaginal Bleeding: Reported scant   Lab Results:   Recent Labs  10/21/14 0530  WBC 14.7*  HGB 11.8*  HCT 34.4*  PLT 226    Assessment: s/p Procedure(s): LAPAROSCOPIC ASSISTED VAGINAL HYSTERECTOMY WITH BILATERAL SALPINGECTOMY CYSTOSCOPY: progressing well, ready for discharge.  Eating, ambulating, flatus with good pain relief on PO meds.  Foley out awaiting void.  Post Op Hb 11.8  Plan: Discharge home today after voiding.  Precautions, instructions and follow up were discussed with the patient.  Prescriptions provided per AVS.  Patient to call the office to arrange a post-operative appointmant in 2 weeks.    Anastasio Auerbach MD, 7:22 AM 10/21/2014

## 2014-10-21 NOTE — Progress Notes (Signed)
Discharge instructions provided to patient and significant other at bedside.  Activity, medications, incision care, follow up appointments, when to call the doctor and community resources discussed.  No questions at this time.  Patient left unit in stable condition with all personal belongings and prescriptions accompanied by staff.  K. Yehia Mcbain, RN------------------------  

## 2014-10-22 ENCOUNTER — Telehealth: Payer: Self-pay

## 2014-10-22 NOTE — Telephone Encounter (Signed)
[employer] Called to see if patient has been admitted or what status was. I told her that patient was discharged on 10/21/14 and was 23 hour observation not inpatient.

## 2014-11-03 ENCOUNTER — Encounter: Payer: Self-pay | Admitting: Gynecology

## 2014-11-03 ENCOUNTER — Ambulatory Visit (INDEPENDENT_AMBULATORY_CARE_PROVIDER_SITE_OTHER): Payer: Managed Care, Other (non HMO) | Admitting: Gynecology

## 2014-11-03 DIAGNOSIS — Z9889 Other specified postprocedural states: Secondary | ICD-10-CM

## 2014-11-03 NOTE — Patient Instructions (Signed)
Follow up in 2 weeks for your next postoperative checkup. Continue to slowly resume normal activities with the exception of nothing in the vagina.

## 2014-11-03 NOTE — Progress Notes (Signed)
Christina Ramsey 1971/02/17 [id number]        43 y.o.  [id number] Patient presents for her two-week postoperative visit status post LAVH bilateral salpingectomies. Has done well without complaints.  Past medical history,surgical history, problem list, medications, allergies, family history and social history were all reviewed and documented in the EPIC chart.  Directed ROS with pertinent positives and negatives documented in the history of present illness/assessment and plan.  Exam: [name] assistant General appearance:  Normal Abdomen soft nontender without masses guarding rebound. Small amount of Dermabond skin adhesive still at the umbilicus. Suprapubic skin incisions healed nicely. Pelvic external BUS vagina normal with cuff suture line intact. Bimanual without masses or tenderness  Assessment/Plan:  43 y.o. [id number] with normal postoperative visit status post LAVH bilateral salpingectomies. Pathology did show adenomyosis and endometriosis. Patient will slowly resume activities with the exception of continued pelvic rest. She is contemplating going back to work early and will call if she needs a written excuse. I stressed though the need to take it slowly and to rest frequently. Follow up in 2 weeks for her next postoperative visit.     Anastasio Auerbach MD, 10:12 AM 11/03/2014

## 2014-11-18 ENCOUNTER — Encounter: Payer: Self-pay | Admitting: Gynecology

## 2014-11-18 ENCOUNTER — Ambulatory Visit (INDEPENDENT_AMBULATORY_CARE_PROVIDER_SITE_OTHER): Payer: Managed Care, Other (non HMO) | Admitting: Gynecology

## 2014-11-18 VITALS — BP 122/80 | Ht 62.0 in | Wt 156.0 lb

## 2014-11-18 DIAGNOSIS — Z9889 Other specified postprocedural states: Secondary | ICD-10-CM

## 2014-11-18 NOTE — Patient Instructions (Signed)
Using vaginal cream nightly for 7 days. Be gentle with insertion of the applicator. Call if your symptoms persist. Slowly resume all normal activities with nothing in the vagina for a another several weeks.  Follow up in August 2016 for annual exam. Sooner if any issues.

## 2014-11-18 NOTE — Progress Notes (Signed)
Christina Ramsey Sep 09, 1971 [id number]        43 y.o.  [id number] presents for her 1 month postop visit status post LAVH. Doing well with no pain. Continues with fatigue and also notes a slight vaginal odor. No significant discharge.  Past medical history,surgical history, problem list, medications, allergies, family history and social history were all reviewed and documented in the EPIC chart.  Directed ROS with pertinent positives and negatives documented in the history of present illness/assessment and plan.  Exam: Christina Ramsey assistant General appearance:  Normal Abdomen soft nontender without masses guarding rebound. Incisions healed nicely. Pelvic external BUS vagina with cuff intact. Bimanual without masses or tenderness. No significant discharge.  Assessment/Plan:  43 y.o. [id number] status post LAVH doing well at one month. Complains of vaginal odor. Exam overall is normal. We'll treat as a low-grade bacterial vaginosis with Cleocin vaginal cream nightly 7 days. Gentle insertion reviewed. Slowly resume all normal activities with the exception of continued pelvic rest for another several weeks. Assuming she continues well then follow up August 2016 when she is due for her annual exam. Sooner if any issues at all.     Anastasio Auerbach MD, 9:50 AM 11/18/2014

## 2015-04-13 ENCOUNTER — Encounter: Payer: Self-pay | Admitting: Gynecology

## 2015-04-21 ENCOUNTER — Encounter: Payer: Self-pay | Admitting: Gynecology

## 2015-05-03 ENCOUNTER — Ambulatory Visit: Payer: Self-pay | Admitting: Surgery

## 2015-05-03 DIAGNOSIS — N6320 Unspecified lump in the left breast, unspecified quadrant: Secondary | ICD-10-CM

## 2015-05-03 NOTE — H&P (Signed)
Christina Ramsey 05/03/2015 2:22 PM Location: [facility] Patient #: [id number] DOB: 12/26/1970 Single / Language: Christina Ramsey Molt / Race: Black or African American Female History of Present Illness Christina Ramsey A. Christina Kagel MD; 05/03/2015 2:43 PM) Patient words: Evaluate left breast mass     Pt sent at the request of Dr Christina Ramsey for left breast mass. This was picked up by the radiologist on her yearly mammogram. Denies breast pain, discharge or change in the appearance. She has bilateral breas implants tha are 43 years old. No history of breast cancer and this is her second mammogram.  The patient is a 44 year old female   Other Problems Christina Ramsey, [place]; 05/03/2015 2:23 PM) Anxiety Disorder Lump In Breast  Past Surgical History Christina Ramsey, [place]; 05/03/2015 2:23 PM) Breast Augmentation Bilateral. Cesarean Section - 1 Gallbladder Surgery - Laparoscopic Hysterectomy (not due to cancer) - Partial Oral Surgery  Diagnostic Studies History Christina Ramsey, [place]; 05/03/2015 2:23 PM) Colonoscopy never Mammogram within last year Pap Smear 1-5 years ago  Allergies Christina Ramsey, [place]; 05/03/2015 2:23 PM) No Known Drug Allergies 05/03/2015  Medication History Christina Ramsey, [name]; 05/03/2015 2:24 PM) Sertraline HCl (50MG  Tablet, Oral) Active. Claritin (10MG  Tablet, Oral) Active. Zoloft (50MG  Tablet, Oral) Active.  Social History Christina Ramsey, [place]; 05/03/2015 2:23 PM) Alcohol use Occasional alcohol use. Caffeine use Carbonated beverages. No drug use Tobacco use Never smoker.  Family History Christina Ramsey, [place]; 05/03/2015 2:23 PM) Family history unknown First Degree Relatives  Pregnancy / Birth History Christina Ramsey, [place]; 05/03/2015 2:23 PM) Age at menarche 67 years. Contraceptive History Depo-provera, Intrauterine device, Oral contraceptives. Gravida 5 Irregular periods Maternal age 80-20 Para 3     Review of Systems Christina Ramsey [name]; 05/03/2015  2:23 PM) General Not Present- Appetite Loss, Chills, Fatigue, Fever, Night Sweats, Weight Gain and Weight Loss. Skin Not Present- Change in Wart/Mole, Dryness, Hives, Jaundice, New Lesions, Non-Healing Wounds, Rash and Ulcer. HEENT Not Present- Earache, Hearing Loss, Hoarseness, Nose Bleed, Oral Ulcers, Ringing in the Ears, Seasonal Allergies, Sinus Pain, Sore Throat, Visual Disturbances, Wears glasses/contact lenses and Yellow Eyes. Respiratory Not Present- Bloody sputum, Chronic Cough, Difficulty Breathing, Snoring and Wheezing. Breast Present- Breast Mass. Not Present- Breast Pain, Nipple Discharge and Skin Changes. Cardiovascular Not Present- Chest Pain, Difficulty Breathing Lying Down, Leg Cramps, Palpitations, Rapid Heart Rate, Shortness of Breath and Swelling of Extremities. Gastrointestinal Not Present- Abdominal Pain, Bloating, Bloody Stool, Change in Bowel Habits, Chronic diarrhea, Constipation, Difficulty Swallowing, Excessive gas, Gets full quickly at meals, Hemorrhoids, Indigestion, Nausea, Rectal Pain and Vomiting. Female Genitourinary Not Present- Frequency, Nocturia, Painful Urination, Pelvic Pain and Urgency. Musculoskeletal Not Present- Back Pain, Joint Pain, Joint Stiffness, Muscle Pain, Muscle Weakness and Swelling of Extremities. Neurological Not Present- Decreased Memory, Fainting, Headaches, Numbness, Seizures, Tingling, Tremor, Trouble walking and Weakness. Psychiatric Present- Anxiety. Not Present- Bipolar, Change in Sleep Pattern, Depression, Fearful and Frequent crying. Endocrine Not Present- Cold Intolerance, Excessive Hunger, Hair Changes, Heat Intolerance, Hot flashes and New Diabetes. Hematology Not Present- Easy Bruising, Excessive bleeding, Gland problems, HIV and Persistent Infections.  Vitals Christina Ramsey [name]; 05/03/2015 2:23 PM) 05/03/2015 2:23 PM Weight: 149.4 lb Height: 62in Body Surface Area: 1.72 m Body Mass Index: 27.33 kg/m Temp.:  97.71F(Temporal)  Pulse: 80 (Regular)  BP: 112/82 (Sitting, Left Arm, Standard)     Physical Exam (Christina Ramsey A. Christina Booz MD; 05/03/2015 2:44 PM)  General Mental Status-Alert. General Appearance-Consistent with stated age. Hydration-Well hydrated. Voice-Normal.  Head and Neck Head-normocephalic, atraumatic with no lesions or palpable  masses. Trachea-midline. Thyroid Gland Characteristics - normal size and consistency.  Chest and Lung Exam Chest and lung exam reveals -quiet, even and easy respiratory effort with no use of accessory muscles and on auscultation, normal breath sounds, no adventitious sounds and normal vocal resonance. Inspection Chest Wall - Normal. Back - normal.  Breast Breast - Left-Symmetric, Non Tender, No Biopsy scars, no Dimpling, No Inflammation, No Lumpectomy scars, No Mastectomy scars, No Peau d' Orange. Breast - Right-Symmetric, Non Tender, No Biopsy scars, no Dimpling, No Inflammation, No Lumpectomy scars, No Mastectomy scars, No Peau d' Orange. Breast Lump-No Palpable Breast Mass. Note: bilateral breast augmentation   Neurologic Neurologic evaluation reveals -alert and oriented x 3 with no impairment of recent or remote memory. Mental Status-Normal.  Musculoskeletal Normal Exam - Left-Upper Extremity Strength Normal and Lower Extremity Strength Normal. Normal Exam - Right-Upper Extremity Strength Normal and Lower Extremity Strength Normal.  Lymphatic Axillary  General Axillary Region: Bilateral - Description - Normal. Tenderness - Non Tender.    Assessment & Plan (Christina Ramsey A. Christina Hosking MD; 05/03/2015 2:46 PM)  LEFT BREAST MASS (611.72  N63) Impression: too deep and near implant for core biopsy Recommend needle or seed localization left breast. Risk of lumpectomy include bleeding, infection, seroma, more surgery, use of seed/wire, wound care, cosmetic deformity and the need for other treatments, death , blood clots,  death. Pt agrees to proceed.  Current Plans Pt Education - [facility] Breast Biopsy HCI Pt Education - Breast biopsy: discussed with patient and provided information. Pt Education - Breast Diseases: discussed with patient and provided information.

## 2015-05-17 ENCOUNTER — Encounter (HOSPITAL_BASED_OUTPATIENT_CLINIC_OR_DEPARTMENT_OTHER): Payer: Self-pay | Admitting: *Deleted

## 2015-05-19 ENCOUNTER — Ambulatory Visit (HOSPITAL_BASED_OUTPATIENT_CLINIC_OR_DEPARTMENT_OTHER): Payer: Managed Care, Other (non HMO) | Admitting: Certified Registered"

## 2015-05-19 ENCOUNTER — Encounter (HOSPITAL_BASED_OUTPATIENT_CLINIC_OR_DEPARTMENT_OTHER)
Admission: RE | Disposition: A | Discharge status: Home / Self Care | Payer: Self-pay | Source: Ambulatory Visit | Attending: Surgery

## 2015-05-19 ENCOUNTER — Ambulatory Visit (HOSPITAL_BASED_OUTPATIENT_CLINIC_OR_DEPARTMENT_OTHER)
Admission: RE | Admit: 2015-05-19 | Discharge: 2015-05-19 | Disposition: A | Discharge status: Home / Self Care | Payer: Managed Care, Other (non HMO) | Source: Ambulatory Visit | Attending: Surgery | Admitting: Surgery

## 2015-05-19 ENCOUNTER — Encounter (HOSPITAL_BASED_OUTPATIENT_CLINIC_OR_DEPARTMENT_OTHER): Payer: Self-pay | Admitting: Certified Registered"

## 2015-05-19 DIAGNOSIS — F419 Anxiety disorder, unspecified: Secondary | ICD-10-CM | POA: Diagnosis not present

## 2015-05-19 DIAGNOSIS — N63 Unspecified lump in breast: Secondary | ICD-10-CM | POA: Diagnosis present

## 2015-05-19 DIAGNOSIS — F329 Major depressive disorder, single episode, unspecified: Secondary | ICD-10-CM | POA: Diagnosis not present

## 2015-05-19 DIAGNOSIS — D242 Benign neoplasm of left breast: Secondary | ICD-10-CM | POA: Diagnosis not present

## 2015-05-19 HISTORY — DX: Depression, unspecified: F32.A

## 2015-05-19 HISTORY — PX: BREAST LUMPECTOMY: SHX2

## 2015-05-19 HISTORY — DX: Major depressive disorder, single episode, unspecified: F32.9

## 2015-05-19 HISTORY — DX: Anxiety disorder, unspecified: F41.9

## 2015-05-19 LAB — POCT HEMOGLOBIN-HEMACUE: HEMOGLOBIN: 15.7 g/dL — AB (ref 12.0–15.0)

## 2015-05-19 SURGERY — BREAST LUMPECTOMY
Anesthesia: Regional | Site: Breast | Laterality: Left

## 2015-05-19 MED ORDER — ONDANSETRON HCL 4 MG/2ML IJ SOLN
INTRAMUSCULAR | Status: DC | PRN
  Administered 2015-05-19: 4 mg via INTRAVENOUS

## 2015-05-19 MED ORDER — BUPIVACAINE-EPINEPHRINE (PF) 0.25% -1:200000 IJ SOLN
INTRAMUSCULAR | Status: DC | PRN
  Administered 2015-05-19: 8 mL

## 2015-05-19 MED ORDER — MIDAZOLAM HCL 2 MG/2ML IJ SOLN
INTRAMUSCULAR | Status: AC
  Filled 2015-05-19: qty 2

## 2015-05-19 MED ORDER — BUPIVACAINE-EPINEPHRINE (PF) 0.25% -1:200000 IJ SOLN
INTRAMUSCULAR | Status: AC
  Filled 2015-05-19: qty 30

## 2015-05-19 MED ORDER — OXYCODONE HCL 5 MG PO TABS: 5.0000 mg | ORAL_TABLET | Freq: Once | ORAL | Status: DC | PRN

## 2015-05-19 MED ORDER — CHLORHEXIDINE GLUCONATE 4 % EX LIQD: 1.0000 "application " | Freq: Once | CUTANEOUS | Status: DC

## 2015-05-19 MED ORDER — CEFAZOLIN SODIUM-DEXTROSE 2-3 GM-% IV SOLR
INTRAVENOUS | Status: AC
  Filled 2015-05-19: qty 50

## 2015-05-19 MED ORDER — PROPOFOL 10 MG/ML IV BOLUS
INTRAVENOUS | Status: DC | PRN
  Administered 2015-05-19: 200 mg via INTRAVENOUS

## 2015-05-19 MED ORDER — OXYCODONE HCL 5 MG/5ML PO SOLN: 5.0000 mg | Freq: Once | ORAL | Status: DC | PRN

## 2015-05-19 MED ORDER — FENTANYL CITRATE (PF) 100 MCG/2ML IJ SOLN
50.0000 ug | INTRAMUSCULAR | Status: AC | PRN
  Administered 2015-05-19: 25 ug via INTRAVENOUS
  Administered 2015-05-19: 50 ug via INTRAVENOUS
  Administered 2015-05-19: 25 ug via INTRAVENOUS

## 2015-05-19 MED ORDER — FENTANYL CITRATE (PF) 100 MCG/2ML IJ SOLN: 25.0000 ug | INTRAMUSCULAR | Status: DC | PRN

## 2015-05-19 MED ORDER — LACTATED RINGERS IV SOLN
INTRAVENOUS | Status: DC
  Administered 2015-05-19 (×2): via INTRAVENOUS

## 2015-05-19 MED ORDER — HYDROCODONE-ACETAMINOPHEN 5-325 MG PO TABS: 1.0000 | ORAL_TABLET | Freq: Four times a day (QID) | ORAL | Status: AC | PRN

## 2015-05-19 MED ORDER — FENTANYL CITRATE (PF) 100 MCG/2ML IJ SOLN
INTRAMUSCULAR | Status: AC
Start: 2015-05-19 — End: 2015-05-19
  Filled 2015-05-19: qty 6

## 2015-05-19 MED ORDER — DEXAMETHASONE SODIUM PHOSPHATE 4 MG/ML IJ SOLN
INTRAMUSCULAR | Status: DC | PRN
  Administered 2015-05-19: 10 mg via INTRAVENOUS

## 2015-05-19 MED ORDER — GLYCOPYRROLATE 0.2 MG/ML IJ SOLN: 0.2000 mg | Freq: Once | INTRAMUSCULAR | Status: DC | PRN

## 2015-05-19 MED ORDER — ONDANSETRON HCL 4 MG/2ML IJ SOLN: 4.0000 mg | Freq: Four times a day (QID) | INTRAMUSCULAR | Status: DC | PRN

## 2015-05-19 MED ORDER — MIDAZOLAM HCL 2 MG/2ML IJ SOLN
1.0000 mg | INTRAMUSCULAR | Status: DC | PRN
  Administered 2015-05-19: 2 mg via INTRAVENOUS

## 2015-05-19 MED ORDER — LIDOCAINE HCL (CARDIAC) 20 MG/ML IV SOLN
INTRAVENOUS | Status: DC | PRN
  Administered 2015-05-19: 60 mg via INTRAVENOUS

## 2015-05-19 MED ORDER — 0.9 % SODIUM CHLORIDE (POUR BTL) OPTIME
TOPICAL | Status: DC | PRN
  Administered 2015-05-19: 200 mL

## 2015-05-19 MED ORDER — CEFAZOLIN SODIUM-DEXTROSE 2-3 GM-% IV SOLR
2.0000 g | INTRAVENOUS | Status: AC
  Administered 2015-05-19: 2 g via INTRAVENOUS

## 2015-05-19 MED ORDER — SCOPOLAMINE 1 MG/3DAYS TD PT72: 1.0000 | MEDICATED_PATCH | Freq: Once | TRANSDERMAL | Status: DC | PRN

## 2015-05-19 SURGICAL SUPPLY — 46 items
APPLIER CLIP 9.375 MED OPEN (MISCELLANEOUS)
BINDER BREAST LRG (GAUZE/BANDAGES/DRESSINGS) IMPLANT
BINDER BREAST MEDIUM (GAUZE/BANDAGES/DRESSINGS) IMPLANT
BINDER BREAST XLRG (GAUZE/BANDAGES/DRESSINGS) IMPLANT
BINDER BREAST XXLRG (GAUZE/BANDAGES/DRESSINGS) IMPLANT
BLADE SURG 15 STRL LF DISP TIS (BLADE) ×2 IMPLANT
BLADE SURG 15 STRL SS (BLADE) ×2
CANISTER SUC SOCK COL 7IN (MISCELLANEOUS) ×4 IMPLANT
CANISTER SUCT 1200ML W/VALVE (MISCELLANEOUS) ×4 IMPLANT
CHLORAPREP W/TINT 26ML (MISCELLANEOUS) ×4 IMPLANT
CLIP APPLIE 9.375 MED OPEN (MISCELLANEOUS) IMPLANT
CLIP TI WIDE RED SMALL 6 (CLIP) IMPLANT
COVER BACK TABLE 60X90IN (DRAPES) ×4 IMPLANT
COVER MAYO STAND STRL (DRAPES) ×4 IMPLANT
COVER PROBE W GEL 5X96 (DRAPES) IMPLANT
DECANTER SPIKE VIAL GLASS SM (MISCELLANEOUS) IMPLANT
DEVICE DUBIN W/COMP PLATE 8390 (MISCELLANEOUS) ×4 IMPLANT
DRAPE LAPAROSCOPIC ABDOMINAL (DRAPES) IMPLANT
DRAPE LAPAROTOMY 100X72 PEDS (DRAPES) ×4 IMPLANT
DRAPE UTILITY XL STRL (DRAPES) ×4 IMPLANT
ELECT COATED BLADE 2.86 ST (ELECTRODE) ×4 IMPLANT
ELECT REM PT RETURN 9FT ADLT (ELECTROSURGICAL) ×4
ELECTRODE REM PT RTRN 9FT ADLT (ELECTROSURGICAL) ×2 IMPLANT
GLOVE BIOGEL PI IND STRL 8 (GLOVE) ×2 IMPLANT
GLOVE BIOGEL PI INDICATOR 8 (GLOVE) ×2
GLOVE ECLIPSE 8.0 STRL XLNG CF (GLOVE) ×4 IMPLANT
GOWN STRL REUS W/ TWL LRG LVL3 (GOWN DISPOSABLE) ×4 IMPLANT
GOWN STRL REUS W/TWL LRG LVL3 (GOWN DISPOSABLE) ×4
HEMOSTAT SNOW SURGICEL 2X4 (HEMOSTASIS) IMPLANT
KIT MARKER MARGIN INK (KITS) ×4 IMPLANT
LIQUID BAND (GAUZE/BANDAGES/DRESSINGS) ×4 IMPLANT
NEEDLE HYPO 25X1 1.5 SAFETY (NEEDLE) ×4 IMPLANT
NS IRRIG 1000ML POUR BTL (IV SOLUTION) ×4 IMPLANT
PACK BASIN DAY SURGERY FS (CUSTOM PROCEDURE TRAY) ×4 IMPLANT
PENCIL BUTTON HOLSTER BLD 10FT (ELECTRODE) ×4 IMPLANT
SLEEVE SCD COMPRESS KNEE MED (MISCELLANEOUS) ×4 IMPLANT
SPONGE LAP 4X18 X RAY DECT (DISPOSABLE) ×4 IMPLANT
SUT MNCRL AB 4-0 PS2 18 (SUTURE) ×4 IMPLANT
SUT SILK 2 0 SH (SUTURE) IMPLANT
SUT VICRYL 3-0 CR8 SH (SUTURE) ×4 IMPLANT
SYR CONTROL 10ML LL (SYRINGE) ×4 IMPLANT
TOWEL OR 17X24 6PK STRL BLUE (TOWEL DISPOSABLE) ×4 IMPLANT
TOWEL OR NON WOVEN STRL DISP B (DISPOSABLE) ×4 IMPLANT
TUBE CONNECTING 20'X1/4 (TUBING) ×1
TUBE CONNECTING 20X1/4 (TUBING) ×3 IMPLANT
YANKAUER SUCT BULB TIP NO VENT (SUCTIONS) ×4 IMPLANT

## 2015-05-19 NOTE — Transfer of Care (Signed)
Immediate Anesthesia Transfer of Care Note  Patient: Christina Ramsey  Procedure(s) Performed: Procedure(s): LEFT BREAST LUMPECTOMY  (Left)  Patient Location: PACU  Anesthesia Type:General  Level of Consciousness: awake and patient cooperative  Airway & Oxygen Therapy: Patient Spontanous Breathing and Patient connected to face mask oxygen  Post-op Assessment: Report given to RN and Post -op Vital signs reviewed and stable  Post vital signs: Reviewed and stable  Last Vitals:  Filed Vitals:   05/19/15 1104  BP:   Pulse: 59  Temp:   Resp: 19    Complications: No apparent anesthesia complications

## 2015-05-19 NOTE — Op Note (Signed)
Preoperative diagnosis: Left breast mass  Postoperative diagnosis: Same  Procedure: Left breast lumpectomy  Surgeon: Erroll Luna M.D.  Anesthesia: LMA with 0.25% Sensorcaine local with epinephrine  EBL: Minimal  Specimen: Left breast mass  Indications for procedure: The patient is a 44 year old female with a left breast mass. This is quite superficial and not amenable to core biopsy, see localization or wire localization. She has had previous implants the radiologist was not comfortable with core biopsy or localizing this. After discussion with radiologist, they felt it was suspicious to warrant  Removal and recommended excision. Using ultrasound guidance, the radiologist marked the area to be removed on the skin. I discussed this with the patient and that there can be a margin of error without wire proceed localization. The lesion was very superficial in her breast tissue is minimal due to her implants. I discussed the possibility of implant rupture, cosmetic deformity, the need for a separate scar from her augmentation at the knee further surgery depending on outcome of biopsy. She understood the above risk and agree to proceed. All risk include bleeding, infection and the need for other operations.  Description of procedure: Patient met in holding area and left breast marked as correct side. The mark was visualized radiologist in the left lower outer quadrant. The patient was taken back to the operating room after answering questions. She was placed supine with left arm at 90. LMA anesthesia initiated. Left breast prepped and draped in a sterile fashion and timeout done. She received preoperative antibiotics. Curvilinear incision made left lower outer quadrant over the index placed by the radiologist. All tissue from the skin down to the attenuated pectoralis muscle was removed. Radiograph revealed a mass in the specimen. Incision closed with 3-0 Vicryl and 4-0 Monocryl. Dermabond applied. All  final counts found to be correct. Patient awoke, extubated and  taken to recovery in satisfactory condition.

## 2015-05-19 NOTE — Anesthesia Postprocedure Evaluation (Signed)
Anesthesia Post Note  Patient: Christina Ramsey  Procedure(s) Performed: Procedure(s) (LRB): LEFT BREAST LUMPECTOMY  (Left)  Anesthesia type: General  Patient location: PACU  Post pain: Pain level controlled and Adequate analgesia  Post assessment: Post-op Vital signs reviewed, Patient's Cardiovascular Status Stable, Respiratory Function Stable, Patent Airway and Pain level controlled  Last Vitals:  Filed Vitals:   05/19/15 1215  BP: 120/71  Pulse: 60  Temp: 36.6 C  Resp: 16    Post vital signs: Reviewed and stable  Level of consciousness: awake, alert  and oriented  Complications: No apparent anesthesia complications

## 2015-05-19 NOTE — Discharge Instructions (Signed)
[facility] °Office Phone Number [phone] ° °BREAST BIOPSY/ PARTIAL MASTECTOMY: POST OP INSTRUCTIONS ° °Always review your discharge instruction sheet given to you by the facility where your surgery was performed. ° °IF YOU HAVE DISABILITY OR FAMILY LEAVE FORMS, YOU MUST BRING THEM TO THE OFFICE FOR PROCESSING.  DO NOT GIVE THEM TO YOUR DOCTOR. ° °1. A prescription for pain medication may be given to you upon discharge.  Take your pain medication as prescribed, if needed.  If narcotic pain medicine is not needed, then you may take acetaminophen (Tylenol) or ibuprofen (Advil) as needed. °2. Take your usually prescribed medications unless otherwise directed °3. If you need a refill on your pain medication, please contact your pharmacy.  They will contact our office to request authorization.  Prescriptions will not be filled after 5pm or on week-ends. °4. You should eat very light the first 24 hours after surgery, such as soup, crackers, pudding, etc.  Resume your normal diet the day after surgery. °5. Most patients will experience some swelling and bruising in the breast.  Ice packs and a good support bra will help.  Swelling and bruising can take several days to resolve.  °6. It is common to experience some constipation if taking pain medication after surgery.  Increasing fluid intake and taking a stool softener will usually help or prevent this problem from occurring.  A mild laxative (Milk of Magnesia or Miralax) should be taken according to package directions if there are no bowel movements after 48 hours. °7. Unless discharge instructions indicate otherwise, you may remove your bandages 24-48 hours after surgery, and you may shower at that time.  You may have steri-strips (small skin tapes) in place directly over the incision.  These strips should be left on the skin for 7-10 days.  If your surgeon used skin glue on the incision, you may shower in 24 hours.  The glue will flake off over the  next 2-3 weeks.  Any sutures or staples will be removed at the office during your follow-up visit. °8. ACTIVITIES:  You may resume regular daily activities (gradually increasing) beginning the next day.  Wearing a good support bra or sports bra minimizes pain and swelling.  You may have sexual intercourse when it is comfortable. °a. You may drive when you no longer are taking prescription pain medication, you can comfortably wear a seatbelt, and you can safely maneuver your car and apply brakes. °b. RETURN TO WORK:  ______________________________________________________________________________________ °9. You should see your doctor in the office for a follow-up appointment approximately two weeks after your surgery.  Your doctor’s nurse will typically make your follow-up appointment when she calls you with your pathology report.  Expect your pathology report 2-3 business days after your surgery.  You may call to check if you do not hear from us after three days. °10. OTHER INSTRUCTIONS: _______________________________________________________________________________________________ _____________________________________________________________________________________________________________________________________ °_____________________________________________________________________________________________________________________________________ °_____________________________________________________________________________________________________________________________________ ° °WHEN TO CALL YOUR DOCTOR: °1. Fever over 101.0 °2. Nausea and/or vomiting. °3. Extreme swelling or bruising. °4. Continued bleeding from incision. °5. Increased pain, redness, or drainage from the incision. ° °The clinic staff is available to answer your questions during regular business hours.  Please don’t hesitate to call and ask to speak to one of the nurses for clinical concerns.  If you have a medical emergency, go to the nearest  emergency room or call 911.  A surgeon from [facility] is always on call at the hospital. ° °For further questions, please visit [email or web address]  ° ° ° °  Post Anesthesia Home Care Instructions ° °Activity: °Get plenty of rest for the remainder of the day. A responsible adult should stay with you for 24 hours following the procedure.  °For the next 24 hours, DO NOT: °-Drive a car °-Operate machinery °-Drink alcoholic beverages °-Take any medication unless instructed by your physician °-Make any legal decisions or sign important papers. ° °Meals: °Start with liquid foods such as gelatin or soup. Progress to regular foods as tolerated. Avoid greasy, spicy, heavy foods. If nausea and/or vomiting occur, drink only clear liquids until the nausea and/or vomiting subsides. Call your physician if vomiting continues. ° °Special Instructions/Symptoms: °Your throat may feel dry or sore from the anesthesia or the breathing tube placed in your throat during surgery. If this causes discomfort, gargle with warm salt water. The discomfort should disappear within 24 hours. ° °If you had a scopolamine patch placed behind your ear for the management of post- operative nausea and/or vomiting: ° °1. The medication in the patch is effective for 72 hours, after which it should be removed.  Wrap patch in a tissue and discard in the trash. Wash hands thoroughly with soap and water. °2. You may remove the patch earlier than 72 hours if you experience unpleasant side effects which may include dry mouth, dizziness or visual disturbances. °3. Avoid touching the patch. Wash your hands with soap and water after contact with the patch. °  ° °

## 2015-05-19 NOTE — H&P (View-Only) (Signed)
Christina Ramsey 05/03/2015 2:22 PM Location: [facility] Patient #: [id number] DOB: 21-Dec-1970 Single / Language: Christina Ramsey / Race: Black or African American Female History of Present Illness Christina Ramsey A. [name] MD; 05/03/2015 2:43 PM) Patient words: Evaluate left breast mass     Pt sent at the request of Dr [name] for left breast mass. This was picked up by the radiologist on her yearly mammogram. Denies breast pain, discharge or change in the appearance. She has bilateral breas implants tha are 43 years old. No history of breast cancer and this is her second mammogram.  The patient is a 44 year old female   Other Problems Christina Ramsey, [place]; 05/03/2015 2:23 PM) Anxiety Disorder Lump In Breast  Past Surgical History Christina Ramsey, [place]; 05/03/2015 2:23 PM) Breast Augmentation Bilateral. Cesarean Section - 1 Gallbladder Surgery - Laparoscopic Hysterectomy (not due to cancer) - Partial Oral Surgery  Diagnostic Studies History Christina Ramsey, [place]; 05/03/2015 2:23 PM) Colonoscopy never Mammogram within last year Pap Smear 1-5 years ago  Allergies Christina Ramsey, [place]; 05/03/2015 2:23 PM) No Known Drug Allergies 05/03/2015  Medication History Christina Ramsey, [name]; 05/03/2015 2:24 PM) Sertraline HCl (50MG  Tablet, Oral) Active. Claritin (10MG  Tablet, Oral) Active. Zoloft (50MG  Tablet, Oral) Active.  Social History Christina Ramsey, [place]; 05/03/2015 2:23 PM) Alcohol use Occasional alcohol use. Caffeine use Carbonated beverages. No drug use Tobacco use Never smoker.  Family History Christina Ramsey, [place]; 05/03/2015 2:23 PM) Family history unknown First Degree Relatives  Pregnancy / Birth History Christina Ramsey, [place]; 05/03/2015 2:23 PM) Age at menarche 85 years. Contraceptive History Depo-provera, Intrauterine device, Oral contraceptives. Gravida 5 Irregular periods Maternal age 37-20 Para 3     Review of Systems Christina Ramsey [name]; 05/03/2015  2:23 PM) General Not Present- Appetite Loss, Chills, Fatigue, Fever, Night Sweats, Weight Gain and Weight Loss. Skin Not Present- Change in Wart/Mole, Dryness, Hives, Jaundice, New Lesions, Non-Healing Wounds, Rash and Ulcer. HEENT Not Present- Earache, Hearing Loss, Hoarseness, Nose Bleed, Oral Ulcers, Ringing in the Ears, Seasonal Allergies, Sinus Pain, Sore Throat, Visual Disturbances, Wears glasses/contact lenses and Yellow Eyes. Respiratory Not Present- Bloody sputum, Chronic Cough, Difficulty Breathing, Snoring and Wheezing. Breast Present- Breast Mass. Not Present- Breast Pain, Nipple Discharge and Skin Changes. Cardiovascular Not Present- Chest Pain, Difficulty Breathing Lying Down, Leg Cramps, Palpitations, Rapid Heart Rate, Shortness of Breath and Swelling of Extremities. Gastrointestinal Not Present- Abdominal Pain, Bloating, Bloody Stool, Change in Bowel Habits, Chronic diarrhea, Constipation, Difficulty Swallowing, Excessive gas, Gets full quickly at meals, Hemorrhoids, Indigestion, Nausea, Rectal Pain and Vomiting. Female Genitourinary Not Present- Frequency, Nocturia, Painful Urination, Pelvic Pain and Urgency. Musculoskeletal Not Present- Back Pain, Joint Pain, Joint Stiffness, Muscle Pain, Muscle Weakness and Swelling of Extremities. Neurological Not Present- Decreased Memory, Fainting, Headaches, Numbness, Seizures, Tingling, Tremor, Trouble walking and Weakness. Psychiatric Present- Anxiety. Not Present- Bipolar, Change in Sleep Pattern, Depression, Fearful and Frequent crying. Endocrine Not Present- Cold Intolerance, Excessive Hunger, Hair Changes, Heat Intolerance, Hot flashes and New Diabetes. Hematology Not Present- Easy Bruising, Excessive bleeding, Gland problems, HIV and Persistent Infections.  Vitals Christina Ramsey [name]; 05/03/2015 2:23 PM) 05/03/2015 2:23 PM Weight: 149.4 lb Height: 62in Body Surface Area: 1.72 m Body Mass Index: 27.33 kg/m Temp.:  97.18F(Temporal)  Pulse: 80 (Regular)  BP: 112/82 (Sitting, Left Arm, Standard)     Physical Exam (Anaijah Augsburger [name]; 05/03/2015 2:44 PM)  General Mental Status-Alert. General Appearance-Consistent with stated age. Hydration-Well hydrated. Voice-Normal.  Head and Neck Head-normocephalic, atraumatic with no lesions or palpable  masses. Trachea-midline. Thyroid Gland Characteristics - normal size and consistency.  Chest and Lung Exam Chest and lung exam reveals -quiet, even and easy respiratory effort with no use of accessory muscles and on auscultation, normal breath sounds, no adventitious sounds and normal vocal resonance. Inspection Chest Wall - Normal. Back - normal.  Breast Breast - Left-Symmetric, Non Tender, No Biopsy scars, no Dimpling, No Inflammation, No Lumpectomy scars, No Mastectomy scars, No Peau d' Orange. Breast - Right-Symmetric, Non Tender, No Biopsy scars, no Dimpling, No Inflammation, No Lumpectomy scars, No Mastectomy scars, No Peau d' Orange. Breast Lump-No Palpable Breast Mass. Note: bilateral breast augmentation   Neurologic Neurologic evaluation reveals -alert and oriented x 3 with no impairment of recent or remote memory. Mental Status-Normal.  Musculoskeletal Normal Exam - Left-Upper Extremity Strength Normal and Lower Extremity Strength Normal. Normal Exam - Right-Upper Extremity Strength Normal and Lower Extremity Strength Normal.  Lymphatic Axillary  General Axillary Region: Bilateral - Description - Normal. Tenderness - Non Tender.    Assessment & Plan ([name] [name]; 05/03/2015 2:46 PM)  LEFT BREAST MASS (611.72  N63) Impression: too deep and near implant for core biopsy Recommend needle or seed localization left breast. Risk of lumpectomy include bleeding, infection, seroma, more surgery, use of seed/wire, wound care, cosmetic deformity and the need for other treatments, death , blood clots,  death. Pt agrees to proceed.  Current Plans Pt Education - [facility] Breast Biopsy HCI Pt Education - Breast biopsy: discussed with patient and provided information. Pt Education - Breast Diseases: discussed with patient and provided information.

## 2015-05-19 NOTE — Interval H&P Note (Signed)
History and Physical Interval Note:  05/19/2015 9:57 AM  Christina Ramsey  has presented today for surgery, with the diagnosis of Left Breast Cancer  The various methods of treatment have been discussed with the patient and family. After consideration of risks, benefits and other options for treatment, the patient has consented to  Left breast lumpectomy. as a surgical intervention .  The patient's history has been reviewed, patient examined, no change in status, stable for surgery.  I have reviewed the patient's chart and labs.  Questions were answered to the patient's satisfaction.  Seed or wire cannot be used according to the radiologist.  He has marked the area on the skin and with the aid of this and preop imaging,  We will excicie.  Told patient that with this technique that there is a margin of error but this is the only way to remove or biopsy this du to superficial location.  She understands and agrees.    Marcianne Ozbun A.

## 2015-05-19 NOTE — Anesthesia Preprocedure Evaluation (Addendum)
Anesthesia Evaluation  Patient identified by MRN, date of birth, ID band Patient awake    Reviewed: Allergy & Precautions, NPO status , Patient's Chart, lab work & pertinent test results  Airway Mallampati: II   Neck ROM: full    Dental   Pulmonary neg pulmonary ROS,  breath sounds clear to auscultation        Cardiovascular negative cardio ROS  Rhythm:regular Rate:Normal     Neuro/Psych  Headaches, PSYCHIATRIC DISORDERS Anxiety Depression    GI/Hepatic   Endo/Other    Renal/GU      Musculoskeletal   Abdominal   Peds  Hematology   Anesthesia Other Findings   Reproductive/Obstetrics                            Anesthesia Physical Anesthesia Plan  ASA: II  Anesthesia Plan: General   Post-op Pain Management:    Induction: Intravenous  Airway Management Planned: LMA  Additional Equipment:   Intra-op Plan:   Post-operative Plan:   Informed Consent: I have reviewed the patients History and Physical, chart, labs and discussed the procedure including the risks, benefits and alternatives for the proposed anesthesia with the patient or authorized representative who has indicated his/her understanding and acceptance.     Plan Discussed with: CRNA, Anesthesiologist and Surgeon  Anesthesia Plan Comments:        Anesthesia Quick Evaluation

## 2015-05-19 NOTE — Anesthesia Procedure Notes (Signed)
Procedure Name: LMA Insertion Date/Time: 05/19/2015 10:23 AM Performed by: Cythia Bachtel D Pre-anesthesia Checklist: Patient identified, Emergency Drugs available, Suction available and Patient being monitored Patient Re-evaluated:Patient Re-evaluated prior to inductionOxygen Delivery Method: Circle System Utilized Preoxygenation: Pre-oxygenation with 100% oxygen Intubation Type: IV induction Ventilation: Mask ventilation without difficulty LMA: LMA inserted LMA Size: 4.0 Number of attempts: 1 Airway Equipment and Method: Bite block Placement Confirmation: positive ETCO2 Tube secured with: Tape Dental Injury: Teeth and Oropharynx as per pre-operative assessment

## 2015-05-20 ENCOUNTER — Encounter (HOSPITAL_BASED_OUTPATIENT_CLINIC_OR_DEPARTMENT_OTHER): Payer: Self-pay | Admitting: Surgery

## 2015-05-20 ENCOUNTER — Encounter: Payer: Self-pay | Admitting: Gynecology
